# Patient Record
Sex: Male | Born: 1990 | Race: Black or African American | Hispanic: No | Smoking: Never smoker
Health system: Southern US, Community
[De-identification: ages and names within clinical notes are randomized; demographics above are authoritative.]

---
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
## 2011-05-21 ENCOUNTER — Other Ambulatory Visit: Payer: Self-pay

## 2011-05-21 ENCOUNTER — Encounter (HOSPITAL_COMMUNITY): Payer: Self-pay | Admitting: Emergency Medicine

## 2011-05-21 ENCOUNTER — Emergency Department (INDEPENDENT_AMBULATORY_CARE_PROVIDER_SITE_OTHER)
Admission: EM | Admit: 2011-05-21 | Discharge: 2011-05-21 | Disposition: A | Discharge status: Nursing Home (Includes ICF) | Payer: BC Managed Care – PPO | Source: Home / Self Care | Attending: Emergency Medicine | Admitting: Emergency Medicine

## 2011-05-21 ENCOUNTER — Emergency Department (HOSPITAL_COMMUNITY): Payer: BC Managed Care – PPO

## 2011-05-21 ENCOUNTER — Emergency Department (HOSPITAL_COMMUNITY)
Admission: EM | Admit: 2011-05-21 | Discharge: 2011-05-21 | Disposition: A | Discharge status: Home / Self Care | Payer: BC Managed Care – PPO | Attending: Emergency Medicine | Admitting: Emergency Medicine

## 2011-05-21 DIAGNOSIS — R55 Syncope and collapse: Secondary | ICD-10-CM

## 2011-05-21 DIAGNOSIS — R42 Dizziness and giddiness: Secondary | ICD-10-CM | POA: Insufficient documentation

## 2011-05-21 DIAGNOSIS — R001 Bradycardia, unspecified: Secondary | ICD-10-CM

## 2011-05-21 DIAGNOSIS — R5381 Other malaise: Secondary | ICD-10-CM | POA: Insufficient documentation

## 2011-05-21 DIAGNOSIS — I498 Other specified cardiac arrhythmias: Secondary | ICD-10-CM

## 2011-05-21 DIAGNOSIS — R079 Chest pain, unspecified: Secondary | ICD-10-CM | POA: Insufficient documentation

## 2011-05-21 LAB — DIFFERENTIAL
Eosinophils Absolute: 0.1 10*3/uL (ref 0.0–0.7)
Lymphs Abs: 2.6 10*3/uL (ref 0.7–4.0)
Neutro Abs: 2.8 10*3/uL (ref 1.7–7.7)
Neutrophils Relative %: 45 % (ref 43–77)

## 2011-05-21 LAB — RAPID URINE DRUG SCREEN, HOSP PERFORMED
Cocaine: NOT DETECTED
Opiates: NOT DETECTED
Tetrahydrocannabinol: POSITIVE — AB

## 2011-05-21 LAB — POCT I-STAT, CHEM 8
BUN: 11 mg/dL (ref 6–23)
Calcium, Ion: 1.23 mmol/L (ref 1.12–1.32)
Chloride: 106 mEq/L (ref 96–112)
Glucose, Bld: 91 mg/dL (ref 70–99)
HCT: 44 % (ref 39.0–52.0)

## 2011-05-21 LAB — CBC
MCH: 28.9 pg (ref 26.0–34.0)
Platelets: 174 10*3/uL (ref 150–400)
RBC: 4.92 MIL/uL (ref 4.22–5.81)
WBC: 6.2 10*3/uL (ref 4.0–10.5)

## 2011-05-21 MED ORDER — SODIUM CHLORIDE 0.9 % IV BOLUS (SEPSIS)
1000.0000 mL | Freq: Once | INTRAVENOUS | Status: AC
  Administered 2011-05-21: 1000 mL via INTRAVENOUS

## 2011-05-21 NOTE — ED Notes (Signed)
Onset 2 weeks ago of symptoms.  Reports headaches, dizziness, and reports an episode of syncope. Reports intermittent left chest pain, sharp pain.  Lying down seems to improve left chest pain.  Activity seems to make pain worse.  No nausea, no vomiting, "maybe" sob.

## 2011-05-21 NOTE — ED Provider Notes (Addendum)
History     CSN: [id number]  Arrival date & time 05/21/11  1105   First MD Initiated Contact with Patient 05/21/11 1208      Chief Complaint  Patient presents with  . Dizziness    (Consider location/radiation/quality/duration/timing/severity/associated sxs/prior treatment) HPI Comments: Patient presents urgent care complaining of multiple symptoms which include headaches dizziness and also experienced a syncopal episode yesterday while he was at the kitchen after having woken up. He also describes that during the last 2 weeks he's been feeling dizzy and fatigued and having headaches on and off and vocational left-sided chest pains that seem to be coming on and off for the course of 2 weeks last episode occurred yesterday as he was just sitting while he was not performing any physical activities. Locations have been felt a bit short of breath as well.    Patient is a 21 y.o. male presenting with syncope. The history is provided by the patient.  Loss of Consciousness This is a recurrent problem. The current episode started more than 1 week ago. Associated symptoms include chest pain, headaches and shortness of breath. The symptoms are aggravated by walking. The symptoms are relieved by nothing. He has tried nothing for the symptoms.    History reviewed. No pertinent past medical history.  History reviewed. No pertinent past surgical history.  Family History  Problem Relation Age of Onset  . Hypertension Mother   . Hypertension Father     History  Substance Use Topics  . Smoking status: Never Smoker   . Smokeless tobacco: Not on file  . Alcohol Use: Yes     ocassionally      Review of Systems  Constitutional: Positive for fatigue. Negative for diaphoresis.  Respiratory: Positive for cough and shortness of breath.   Cardiovascular: Positive for chest pain and syncope. Negative for palpitations.  Neurological: Positive for dizziness, syncope, light-headedness and headaches.  Negative for weakness.    Allergies  Review of patient's allergies indicates no known allergies.  Home Medications  No current outpatient prescriptions on file.  BP 125/70  Pulse 50  Temp(Src) 98.5 F (36.9 C) (Oral)  Resp 18  SpO2 100%  Physical Exam  Nursing note and vitals reviewed. Constitutional: He appears well-developed. No distress.  HENT:  Head: Normocephalic.  Eyes: Pupils are equal, round, and reactive to light.  Neck: Normal range of motion. Neck supple.  Cardiovascular: Normal heart sounds and normal pulses.   No extrasystoles are present. Bradycardia present.  PMI is not displaced.  Exam reveals no gallop, no friction rub and no decreased pulses.   No murmur heard. Pulmonary/Chest: Effort normal and breath sounds normal. He has no decreased breath sounds.  Neurological: He is alert. He has normal strength. He displays no atrophy. No cranial nerve deficit or sensory deficit. He exhibits normal muscle tone. He displays a negative Romberg sign. He displays no seizure activity.  Skin: He is not diaphoretic.    ED Course  Procedures (including critical care time)  Labs Reviewed - No data to display No results found.   1. Syncope   2. Bradycardia   3. Dizziness       MDM  Patient been symptomatic for about 2 weeks including dizziness and a episode of syncope and intermittent left-sided chest pains at urgent care seems normotensive with consistent bradycardia. Patient exhibits a first degree AV block pulse ranges from 48-52 per minute and patient is symptomatic. We will transfer to the emergency department for further evaluation.  Jimmie Molly, MD 05/21/11 1244  Jimmie Molly, MD 05/21/11 1257

## 2011-05-21 NOTE — Discharge Instructions (Signed)
Followup with [facility]., Call Monday  and make an appointment. If she starts feeling dizzy sit down immediately so you do not fall. Drink plenty of  fluids and stay hydrated. Return for severe pain shortness of breath or any other concerns. EKG today does not show any heart problems in her labs are normal. There is a copy in your discharge.

## 2011-05-21 NOTE — ED Notes (Signed)
Pt c/o intermittent chest pain x3wks. Pt states pain does not radiate and feels like a discomfort. Pt states he passed out 3 days ago and hit head on a door and on the ground. Pt states currently he feels weak, denies dizziness, n/v.

## 2011-05-21 NOTE — ED Notes (Signed)
Patient transported to X-ray 

## 2011-05-21 NOTE — ED Notes (Signed)
Pt sent from urgent care for further eval of dizziness and left sided chest pain x 2-3 weeks. Pt reports had syncopal episode yesterday.

## 2011-05-21 NOTE — ED Provider Notes (Signed)
Medical screening examination/treatment/procedure(s) were conducted as a shared visit with non-physician practitioner(s) and myself.  I personally evaluated the patient during the encounter  Timothy Horn, MD 05/21/11 2125

## 2011-05-21 NOTE — ED Provider Notes (Signed)
History     CSN: [id number]  Arrival date & time 05/21/11  1319   None     Chief Complaint  Patient presents with  . Dizziness  . Chest Pain    (Consider location/radiation/quality/duration/timing/severity/associated sxs/prior treatment) Patient is a 21 y.o. male presenting with chest pain. The history is provided by the patient. No language interpreter was used.  Chest Pain The chest pain began 1 - 2 weeks ago. Chest pain occurs intermittently. At its most intense, the pain is at 8/10. The pain is currently at 0/10. The severity of the pain is mild. The quality of the pain is described as aching. The pain does not radiate. Chest pain is worsened by exertion. Primary symptoms include fatigue, syncope and dizziness. Pertinent negatives for primary symptoms include no fever, no shortness of breath, no cough, no palpitations, no abdominal pain and no vomiting.  Before the onset of the syncopal episode there was dizziness. There was no weakness. The syncopal episode did not occur with palpitations or shortness of breath. There is no history of congestive heart failure.   Dizziness does not occur with vomiting, weakness or diaphoresis.  Pertinent negatives for associated symptoms include no claudication, no diaphoresis, no lower extremity edema, no numbness, no orthopnea, no paroxysmal nocturnal dyspnea and no weakness. He tried nothing for the symptoms. Risk factors include smoking/tobacco exposure, male gender and obesity.  Pertinent negatives for past medical history include no aneurysm, no anxiety/panic attacks, no arrhythmia, no CAD, no cancer, no COPD, no CHF, no diabetes and no DVT.  His family medical history is significant for hyperlipidemia in family and hypertension in family.  Procedure history is negative for cardiac catheterization, echocardiogram, persantine thallium, stress echo, stress thallium and exercise treadmill test.     History reviewed. No pertinent past medical  history.  History reviewed. No pertinent past surgical history.  Family History  Problem Relation Age of Onset  . Hypertension Mother   . Hypertension Father     History  Substance Use Topics  . Smoking status: Never Smoker   . Smokeless tobacco: Not on file  . Alcohol Use: Yes     ocassionally      Review of Systems  Constitutional: Positive for fatigue. Negative for fever and diaphoresis.  Respiratory: Negative for cough and shortness of breath.   Cardiovascular: Positive for chest pain and syncope. Negative for palpitations, orthopnea and claudication.  Gastrointestinal: Negative for vomiting and abdominal pain.  Neurological: Positive for dizziness. Negative for weakness and numbness.  All other systems reviewed and are negative.    Allergies  Review of patient's allergies indicates no known allergies.  Home Medications  No current outpatient prescriptions on file.  BP 126/66  Pulse 48  Temp(Src) 98.3 F (36.8 C) (Oral)  Resp 12  SpO2 100%  Physical Exam  Nursing note and vitals reviewed. Constitutional: He is oriented to person, place, and time. He appears well-developed and well-nourished.  HENT:  Head: Normocephalic and atraumatic.  Eyes: Pupils are equal, round, and reactive to light.  Neck: Neck supple.  Cardiovascular: Normal rate and regular rhythm.  Exam reveals no gallop and no friction rub.   No murmur heard. Pulmonary/Chest: Effort normal and breath sounds normal. No respiratory distress.  Abdominal: Soft. He exhibits no distension.  Musculoskeletal: Normal range of motion.  Neurological: He is alert and oriented to person, place, and time. No cranial nerve deficit.  Skin: Skin is warm and dry.  Psychiatric: He has a normal mood and  affect.    ED Course  Procedures (including critical care time)   Labs Reviewed  CBC  DIFFERENTIAL  URINALYSIS, ROUTINE W REFLEX MICROSCOPIC   No results found.   No diagnosis found.    MDM    Labs Reviewed  URINE RAPID DRUG SCREEN (HOSP PERFORMED) - Abnormal; Notable for the following:    Tetrahydrocannabinol POSITIVE (*)    All other components within normal limits  CBC  DIFFERENTIAL  POCT I-STAT, CHEM 8  POCT I-STAT TROPONIN I       Date: 05/21/2011  Rate: 45  Rhythm: normal sinus rhythm  QRS Axis: normal  Intervals: normal  ST/T Wave abnormalities: normal  Conduction Disutrbances:none  Narrative Interpretation: ST elev probable normal early repol pattern  Old EKG Reviewed: none available  From urgent care with c/o syncopal episode yesterday and intermittant chest pain, dizziness and fatigue x 2 weeks.  EKG sb x 2.  No further chest pain in ER, perc negative.  No long trips or calf pain or sudden death family hx.  No acute distress noted. No orthostatic.  Will follow up with [facility] this week.  Return if worse.    Jethro Bastos, NP 05/21/11 2011

## 2011-05-21 NOTE — ED Notes (Signed)
Pt returned from xray

## 2011-05-21 NOTE — ED Provider Notes (Signed)
This 21 year old male has felt generally weak and lightheaded for the last couple of weeks and yesterday felt lightheaded got gradually worse the leg is pass out couldn't make it back to his bed time as he is feeling lightheaded and generally weak and then had a brief witnessed syncopal spell and hit his head but woke up without severe headache or any focal neurologic symptoms, he does not have any history of any exertional chest discomfort or syncope.  Hurman Horn, MD 05/21/11 2141

## 2013-03-31 ENCOUNTER — Encounter (HOSPITAL_COMMUNITY): Payer: Self-pay | Admitting: Emergency Medicine

## 2013-03-31 ENCOUNTER — Emergency Department (INDEPENDENT_AMBULATORY_CARE_PROVIDER_SITE_OTHER)
Admission: EM | Admit: 2013-03-31 | Discharge: 2013-03-31 | Disposition: A | Discharge status: Home / Self Care | Payer: BC Managed Care – PPO | Source: Home / Self Care | Attending: Family Medicine | Admitting: Family Medicine

## 2013-03-31 DIAGNOSIS — J069 Acute upper respiratory infection, unspecified: Secondary | ICD-10-CM

## 2013-03-31 LAB — POCT RAPID STREP A: Streptococcus, Group A Screen (Direct): NEGATIVE

## 2013-03-31 MED ORDER — IPRATROPIUM BROMIDE 0.06 % NA SOLN: 2.0000 | Freq: Four times a day (QID) | NASAL | Status: DC

## 2013-03-31 MED ORDER — GUAIFENESIN-CODEINE 100-10 MG/5ML PO SOLN: 5.0000 mL | Freq: Every evening | ORAL | Status: DC | PRN

## 2013-03-31 MED ORDER — PREDNISONE 10 MG PO TABS: 30.0000 mg | ORAL_TABLET | Freq: Every day | ORAL | Status: DC

## 2013-03-31 NOTE — ED Notes (Signed)
C/o cold sx States head pressure, body ache, cough, sneezing, and runny nose.  otc medication taking but no relief.

## 2013-03-31 NOTE — ED Provider Notes (Signed)
Timothy Wagner is a 22 y.o. male who presents to Urgent Care today for 3 days of congestion runny nose headache and body aches sore throat and cough. Patient has tried TheraFlu NyQuil and Tylenol. This helps a bit. No cervical bleeding no nausea vomiting or diarrhea. Patient does note decreased hearing in his left ear. He feels well otherwise.   History reviewed. No pertinent past medical history. History  Substance Use Topics  . Smoking status: Never Smoker   . Smokeless tobacco: Not on file  . Alcohol Use: Yes     Comment: ocassionally   ROS as above Medications reviewed. No current facility-administered medications for this encounter.   Current Outpatient Prescriptions  Medication Sig Dispense Refill  . guaiFENesin-codeine 100-10 MG/5ML syrup Take 5 mLs by mouth at bedtime as needed for cough.  120 mL  0  . ipratropium (ATROVENT) 0.06 % nasal spray Place 2 sprays into both nostrils 4 (four) times daily.  15 mL  1  . predniSONE (DELTASONE) 10 MG tablet Take 3 tablets (30 mg total) by mouth daily.  15 tablet  0    Exam:  BP 126/80  Pulse 65  Temp(Src) 99.5 F (37.5 C) (Oral)  SpO2 96% Gen: Well NAD HEENT: EOMI,  MMM right tympanic membrane is normal. Left is occluded by a wad of cotton. Once the cotton was removed the patient had normal appearing tympanic membrane. His posterior pharynx showed cobblestoning Lungs: Normal work of breathing. CTABL Heart: RRR no MRG Abd: NABS, Soft. NT, ND Exts: Non edematous BL  LE, warm and well perfused.   Results for orders placed during the hospital encounter of 03/31/13 (from the past 24 hour(s))  POCT RAPID STREP A ([facility]ARE ONLY)     Status: None   Collection Time    03/31/13  1:26 PM      Result Value Range   Streptococcus, Group A Screen (Direct) NEGATIVE  NEGATIVE   No results found.  Assessment and Plan: 22 y.o. male with viral URI. Plan for symptomatic management with low dose and short duration prednisone, Atrovent  nasal spray, and codeine containing cough medication. Work note provided. Discussed warning signs or symptoms. Please see discharge instructions. Patient expresses understanding.      Rodolph Bong, MD 03/31/13 1341

## 2013-04-02 LAB — CULTURE, GROUP A STREP

## 2014-04-25 ENCOUNTER — Emergency Department (HOSPITAL_COMMUNITY)
Admission: EM | Admit: 2014-04-25 | Discharge: 2014-04-25 | Disposition: A | Discharge status: Home / Self Care | Payer: BLUE CROSS/BLUE SHIELD | Attending: Emergency Medicine | Admitting: Emergency Medicine

## 2014-04-25 ENCOUNTER — Encounter (HOSPITAL_COMMUNITY): Payer: Self-pay | Admitting: Emergency Medicine

## 2014-04-25 DIAGNOSIS — N4889 Other specified disorders of penis: Secondary | ICD-10-CM | POA: Diagnosis present

## 2014-04-25 DIAGNOSIS — Z79899 Other long term (current) drug therapy: Secondary | ICD-10-CM | POA: Insufficient documentation

## 2014-04-25 DIAGNOSIS — Z7952 Long term (current) use of systemic steroids: Secondary | ICD-10-CM | POA: Diagnosis not present

## 2014-04-25 MED ORDER — AZITHROMYCIN 250 MG PO TABS
1000.0000 mg | ORAL_TABLET | Freq: Once | ORAL | Status: AC
  Administered 2014-04-25: 1000 mg via ORAL
  Filled 2014-04-25: qty 4

## 2014-04-25 MED ORDER — CEFTRIAXONE SODIUM 250 MG IJ SOLR
250.0000 mg | Freq: Once | INTRAMUSCULAR | Status: AC
  Administered 2014-04-25: 250 mg via INTRAMUSCULAR
  Filled 2014-04-25: qty 250

## 2014-04-25 NOTE — ED Provider Notes (Signed)
CSN: [id number]     Arrival date & time 04/25/14  1042 History   First MD Initiated Contact with Patient 04/25/14 1050     No chief complaint on file.    (Consider location/radiation/quality/duration/timing/severity/associated sxs/prior Treatment) HPI   Pt presents with tingling in the tip of his penis x several weeks.  He has two male sexual partners and has unprotected sex with both - he does not know of any problems they may or may not be having.  Denies fevers, chills, myalgias, abdominal pain, N/V, penile discharge, genital lesions, dysuria, urinary frequency or urgency, testicular pain or swelling.     History reviewed. No pertinent past medical history. History reviewed. No pertinent past surgical history. Family History  Problem Relation Age of Onset  . Hypertension Mother   . Hypertension Father    History  Substance Use Topics  . Smoking status: Never Smoker   . Smokeless tobacco: Not on file  . Alcohol Use: Yes     Comment: ocassionally    Review of Systems  Constitutional: Negative for fever and chills.  Gastrointestinal: Negative for nausea, vomiting and abdominal pain.  Genitourinary: Negative for dysuria, frequency, discharge, penile swelling, scrotal swelling and testicular pain.  Musculoskeletal: Negative for myalgias.  Skin: Negative for color change and wound.  Allergic/Immunologic: Negative for immunocompromised state.      Allergies  Review of patient's allergies indicates no known allergies.  Home Medications   Prior to Admission medications   Medication Sig Start Date End Date Taking? Authorizing Provider  guaiFENesin-codeine 100-10 MG/5ML syrup Take 5 mLs by mouth at bedtime as needed for cough. 03/31/13   Rodolph BongEvan S Corey, MD  ipratropium (ATROVENT) 0.06 % nasal spray Place 2 sprays into both nostrils 4 (four) times daily. 03/31/13   Rodolph BongEvan S Corey, MD  predniSONE (DELTASONE) 10 MG tablet Take 3 tablets (30 mg total) by mouth daily. 03/31/13   Rodolph BongEvan  S Corey, MD   BP 170/78 mmHg  Pulse 74  Temp(Src) 98 F (36.7 C) (Oral)  Resp 19  SpO2 100% Physical Exam  Constitutional: He appears well-developed and well-nourished. No distress.  HENT:  Head: Normocephalic and atraumatic.  Neck: Neck supple.  Pulmonary/Chest: Effort normal.  Abdominal: Soft. There is no tenderness. There is no rigidity, no rebound and no guarding.  Genitourinary: Testes normal and penis normal. Right testis shows no mass, no swelling and no tenderness. Right testis is descended. Left testis shows no mass, no swelling and no tenderness. Left testis is descended. Circumcised. No penile erythema or penile tenderness. No discharge found.  Lymphadenopathy:       Right: No inguinal adenopathy present.       Left: No inguinal adenopathy present.  Neurological: He is alert.  Skin: He is not diaphoretic.  Nursing note and vitals reviewed.   ED Course  Procedures (including critical care time) Labs Review Labs Reviewed  HIV ANTIBODY (ROUTINE TESTING)  RPR  GC/CHLAMYDIA PROBE AMP ([place])    Imaging Review No results found.   EKG Interpretation None      MDM   Final diagnoses:  Pain in penis    Afebrile, nontoxic patient with tingling at tip of penis x weeks.  Engages in unprotected sexual activity.  No other symptoms.  Full HIV/STD panel - pt aware all results are pending.  Exam unremarkable. He requests treatment for GC/Chlam in ED.   D/C home with referral to health department, information about safe sex and STDs.   Discussed result,  findings, treatment, and follow up  with patient.  Pt given return precautions.  Pt verbalizes understanding and agrees with plan.         Trixie Dredge, PA-C 04/25/14 1136  Tilden Fossa, MD 04/25/14 1137

## 2014-04-25 NOTE — Discharge Instructions (Signed)
Read the information below.  You may return to the Emergency Department at any time for worsening condition or any new symptoms that concern you.  If you develop high fevers, testicular or abdominal pain, uncontrolled vomiting, or are unable to tolerate fluids by mouth, return to the ER for a recheck.

## 2014-04-25 NOTE — ED Notes (Signed)
C/O "tingling" in urethra x 2 weeks. Denies discharge or burning with urination.

## 2014-04-26 LAB — HIV ANTIBODY (ROUTINE TESTING W REFLEX): HIV-1/HIV-2 Ab: NONREACTIVE

## 2014-04-26 LAB — RPR: RPR Ser Ql: NONREACTIVE

## 2014-04-28 LAB — GC/CHLAMYDIA PROBE AMP (~~LOC~~) NOT AT ARMC
Chlamydia: NEGATIVE
Neisseria Gonorrhea: NEGATIVE

## 2015-06-23 ENCOUNTER — Emergency Department (HOSPITAL_COMMUNITY)
Admission: EM | Admit: 2015-06-23 | Discharge: 2015-06-23 | Discharge status: Against Medical Advice | Payer: BLUE CROSS/BLUE SHIELD

## 2015-06-23 NOTE — ED Notes (Signed)
Called pt multiple times with no answer.  

## 2015-06-23 NOTE — ED Notes (Signed)
Attempted to call pt to triage x1.  

## 2015-06-24 ENCOUNTER — Emergency Department (HOSPITAL_COMMUNITY)
Admission: EM | Admit: 2015-06-24 | Discharge: 2015-06-24 | Disposition: A | Discharge status: Home / Self Care | Payer: BLUE CROSS/BLUE SHIELD | Attending: Emergency Medicine | Admitting: Emergency Medicine

## 2015-06-24 ENCOUNTER — Encounter (HOSPITAL_COMMUNITY): Payer: Self-pay | Admitting: Emergency Medicine

## 2015-06-24 DIAGNOSIS — Z202 Contact with and (suspected) exposure to infections with a predominantly sexual mode of transmission: Secondary | ICD-10-CM

## 2015-06-24 DIAGNOSIS — Z7952 Long term (current) use of systemic steroids: Secondary | ICD-10-CM | POA: Diagnosis not present

## 2015-06-24 LAB — URINALYSIS, ROUTINE W REFLEX MICROSCOPIC
BILIRUBIN URINE: NEGATIVE
Glucose, UA: NEGATIVE mg/dL
Hgb urine dipstick: NEGATIVE
Ketones, ur: NEGATIVE mg/dL
Leukocytes, UA: NEGATIVE
NITRITE: NEGATIVE
Protein, ur: NEGATIVE mg/dL
SPECIFIC GRAVITY, URINE: 1.031 — AB (ref 1.005–1.030)
pH: 6 (ref 5.0–8.0)

## 2015-06-24 MED ORDER — STERILE WATER FOR INJECTION IJ SOLN
INTRAMUSCULAR | Status: AC
  Filled 2015-06-24: qty 10

## 2015-06-24 MED ORDER — AZITHROMYCIN 250 MG PO TABS
1000.0000 mg | ORAL_TABLET | Freq: Once | ORAL | Status: AC
  Administered 2015-06-24: 1000 mg via ORAL
  Filled 2015-06-24: qty 4

## 2015-06-24 MED ORDER — CEFTRIAXONE SODIUM 250 MG IJ SOLR
250.0000 mg | INTRAMUSCULAR | Status: DC
  Administered 2015-06-24: 250 mg via INTRAMUSCULAR
  Filled 2015-06-24: qty 250

## 2015-06-24 NOTE — ED Notes (Signed)
Patient states possibly exposed to chlamydia.   Patient denies any symptoms.

## 2015-06-24 NOTE — ED Provider Notes (Signed)
CSN: [id number]     Arrival date & time 06/24/15  1041 History   First MD Initiated Contact with Patient 06/24/15 1139     Chief Complaint  Patient presents with  . SEXUALLY TRANSMITTED DISEASE     (Consider location/radiation/quality/duration/timing/severity/associated sxs/prior Treatment) HPI Comments: Pt states that he was exposed to chylamydia. Denies any discharge or urinary symptoms. No abdominal pain or fever. Has history of std 6 years ago. Pt is not allergic to any medications.  The history is provided by the patient. No language interpreter was used.    History reviewed. No pertinent past medical history. History reviewed. No pertinent past surgical history. Family History  Problem Relation Age of Onset  . Hypertension Mother   . Hypertension Father    Social History  Substance Use Topics  . Smoking status: Never Smoker   . Smokeless tobacco: None  . Alcohol Use: Yes     Comment: ocassionally    Review of Systems  All other systems reviewed and are negative.     Allergies  Review of patient's allergies indicates no known allergies.  Home Medications   Prior to Admission medications   Medication Sig Start Date End Date Taking? Authorizing Provider  guaiFENesin-codeine 100-10 MG/5ML syrup Take 5 mLs by mouth at bedtime as needed for cough. 03/31/13   Rodolph BongEvan S Corey, MD  ipratropium (ATROVENT) 0.06 % nasal spray Place 2 sprays into both nostrils 4 (four) times daily. 03/31/13   Rodolph BongEvan S Corey, MD  predniSONE (DELTASONE) 10 MG tablet Take 3 tablets (30 mg total) by mouth daily. 03/31/13   Rodolph BongEvan S Corey, MD   BP 159/101 mmHg  Pulse 63  Temp(Src) 98.4 F (36.9 C) (Oral)  Resp 18  SpO2 97% Physical Exam  Constitutional: He is oriented to person, place, and time. He appears well-developed and well-nourished.  Cardiovascular: Normal rate and regular rhythm.   Pulmonary/Chest: Effort normal and breath sounds normal.  Abdominal: Soft. Bowel sounds are normal. There  is no tenderness.  Genitourinary: Penis normal. No penile tenderness.  Musculoskeletal: Normal range of motion.  Neurological: He is alert and oriented to person, place, and time.  Skin: Skin is warm and dry.  Nursing note and vitals reviewed.   ED Course  Procedures (including critical care time) Labs Review Labs Reviewed  URINALYSIS, ROUTINE W REFLEX MICROSCOPIC (NOT AT [facility]) - Abnormal; Notable for the following:    Specific Gravity, Urine 1.031 (*)    All other components within normal limits  GC/CHLAMYDIA PROBE AMP ([place]    Imaging Review No results found. I have personally reviewed and evaluated these images and lab results as part of my medical decision-making.   EKG Interpretation None      MDM   Final diagnoses:  STD exposure    Pt treated no sign of infection in the urine    Teressa LowerVrinda Ferdinand Revoir, NP 06/24/15 1247  Blane OharaJoshua Zavitz, MD 06/24/15 1714

## 2015-06-24 NOTE — Discharge Instructions (Signed)
Sexually Transmitted Disease °A sexually transmitted disease (STD) is a disease or infection that may be passed (transmitted) from person to person, usually during sexual activity. This may happen by way of saliva, semen, blood, vaginal mucus, or urine. Common STDs include: °· Gonorrhea. °· Chlamydia. °· Syphilis. °· HIV and AIDS. °· Genital herpes. °· Hepatitis B and C. °· Trichomonas. °· Human papillomavirus (HPV). °· Pubic lice. °· Scabies. °· Mites. °· Bacterial vaginosis. °WHAT ARE CAUSES OF STDs? °An STD may be caused by bacteria, a virus, or parasites. STDs are often transmitted during sexual activity if one person is infected. However, they may also be transmitted through nonsexual means. STDs may be transmitted after:  °· Sexual intercourse with an infected person. °· Sharing sex toys with an infected person. °· Sharing needles with an infected person or using unclean piercing or tattoo needles. °· Having intimate contact with the genitals, mouth, or rectal areas of an infected person. °· Exposure to infected fluids during birth. °WHAT ARE THE SIGNS AND SYMPTOMS OF STDs? °Different STDs have different symptoms. Some people may not have any symptoms. If symptoms are present, they may include: °· Painful or bloody urination. °· Pain in the pelvis, abdomen, vagina, anus, throat, or eyes. °· A skin rash, itching, or irritation. °· Growths, ulcerations, blisters, or sores in the genital and anal areas. °· Abnormal vaginal discharge with or without bad odor. °· Penile discharge in men. °· Fever. °· Pain or bleeding during sexual intercourse. °· Swollen glands in the groin area. °· Yellow skin and eyes (jaundice). This is seen with hepatitis. °· Swollen testicles. °· Infertility. °· Sores and blisters in the mouth. °HOW ARE STDs DIAGNOSED? °To make a diagnosis, your health care provider may: °· Take a medical history. °· Perform a physical exam. °· Take a sample of any discharge to examine. °· Swab the throat,  cervix, opening to the penis, rectum, or vagina for testing. °· Test a sample of your first morning urine. °· Perform blood tests. °· Perform a Pap test, if this applies. °· Perform a colposcopy. °· Perform a laparoscopy. °HOW ARE STDs TREATED? °Treatment depends on the STD. Some STDs may be treated but not cured. °· Chlamydia, gonorrhea, trichomonas, and syphilis can be cured with antibiotic medicine. °· Genital herpes, hepatitis, and HIV can be treated, but not cured, with prescribed medicines. The medicines lessen symptoms. °· Genital warts from HPV can be treated with medicine or by freezing, burning (electrocautery), or surgery. Warts may come back. °· HPV cannot be cured with medicine or surgery. However, abnormal areas may be removed from the cervix, vagina, or vulva. °· If your diagnosis is confirmed, your recent sexual partners need treatment. This is true even if they are symptom-free or have a negative culture or evaluation. They should not have sex until their health care providers say it is okay. °· Your health care provider may test you for infection again 3 months after treatment. °HOW CAN I REDUCE MY RISK OF GETTING AN STD? °Take these steps to reduce your risk of getting an STD: °· Use latex condoms, dental dams, and water-soluble lubricants during sexual activity. Do not use petroleum jelly or oils. °· Avoid having multiple sex partners. °· Do not have sex with someone who has other sex partners °· Do not have sex with anyone you do not know or who is at high risk for an STD. °· Avoid risky sex practices that can break your skin. °· Do not have sex   if you have open sores on your mouth or skin. °· Avoid drinking too much alcohol or taking illegal drugs. Alcohol and drugs can affect your judgment and put you in a vulnerable position. °· Avoid engaging in oral and anal sex acts. °· Get vaccinated for HPV and hepatitis. If you have not received these vaccines in the past, talk to your health care  provider about whether one or both might be right for you. °· If you are at risk of being infected with HIV, it is recommended that you take a prescription medicine daily to prevent HIV infection. This is called pre-exposure prophylaxis (PrEP). You are considered at risk if: °¨ You are a man who has sex with other men (MSM). °¨ You are a heterosexual man or woman and are sexually active with more than one partner. °¨ You take drugs by injection. °¨ You are sexually active with a partner who has HIV. °· Talk with your health care provider about whether you are at high risk of being infected with HIV. If you choose to begin PrEP, you should first be tested for HIV. You should then be tested every 3 months for as long as you are taking PrEP. °WHAT SHOULD I DO IF I THINK I HAVE AN STD? °· See your health care provider. °· Tell your sexual partner(s). They should be tested and treated for any STDs. °· Do not have sex until your health care provider says it is okay. °WHEN SHOULD I GET IMMEDIATE MEDICAL CARE? °Contact your health care provider right away if:  °· You have severe abdominal pain. °· You are a man and notice swelling or pain in your testicles. °· You are a woman and notice swelling or pain in your vagina. °  °This information is not intended to replace advice given to you by your health care provider. Make sure you discuss any questions you have with your health care provider. °  °Document Released: 06/11/2002 Document Revised: 04/11/2014 Document Reviewed: 10/09/2012 °Elsevier Interactive Patient Education ©2016 [employer] ° °

## 2015-06-25 LAB — GC/CHLAMYDIA PROBE AMP (~~LOC~~) NOT AT ARMC
Chlamydia: NEGATIVE
NEISSERIA GONORRHEA: NEGATIVE

## 2017-02-27 ENCOUNTER — Ambulatory Visit (INDEPENDENT_AMBULATORY_CARE_PROVIDER_SITE_OTHER): Payer: BLUE CROSS/BLUE SHIELD

## 2017-02-27 ENCOUNTER — Ambulatory Visit (HOSPITAL_COMMUNITY)
Admission: EM | Admit: 2017-02-27 | Discharge: 2017-02-27 | Disposition: A | Discharge status: Home / Self Care | Payer: BLUE CROSS/BLUE SHIELD | Attending: Physician Assistant | Admitting: Physician Assistant

## 2017-02-27 ENCOUNTER — Encounter (HOSPITAL_COMMUNITY): Payer: Self-pay | Admitting: Emergency Medicine

## 2017-02-27 DIAGNOSIS — S63501A Unspecified sprain of right wrist, initial encounter: Secondary | ICD-10-CM | POA: Diagnosis not present

## 2017-02-27 MED ORDER — NAPROXEN 500 MG PO TABS: 500.0000 mg | ORAL_TABLET | Freq: Two times a day (BID) | ORAL | 0 refills | Status: DC

## 2017-02-27 NOTE — ED Triage Notes (Signed)
Pt sts right wrist pain after horse play with friend 3 days ago

## 2017-02-27 NOTE — ED Provider Notes (Signed)
[facility]    CSN: [id number] Arrival date & time: 02/27/17  1257     History   Chief Complaint Chief Complaint  Patient presents with  . Wrist Pain    HPI Timothy Wagner is a 26 y.o. male.   26 year-old male, presenting today complaining of right wrist pain. States that he was "playing around" 3 days ago and believes he twisted his wrist. Pain and swelling since that time. He is right handed. Has not tried anything at home for his symptoms.    The history is provided by the patient.  Wrist Pain  This is a new problem. The current episode started more than 2 days ago. The problem occurs constantly. The problem has not changed since onset.Pertinent negatives include no chest pain, no abdominal pain, no headaches and no shortness of breath. The symptoms are aggravated by twisting and bending. Nothing relieves the symptoms. He has tried nothing for the symptoms. The treatment provided no relief.    History reviewed. No pertinent past medical history.  There are no active problems to display for this patient.   History reviewed. No pertinent surgical history.     Home Medications    Prior to Admission medications   Medication Sig Start Date End Date Taking? Authorizing Provider  guaiFENesin-codeine 100-10 MG/5ML syrup Take 5 mLs by mouth at bedtime as needed for cough. 03/31/13   Rodolph Bongorey, Evan S, MD  ipratropium (ATROVENT) 0.06 % nasal spray Place 2 sprays into both nostrils 4 (four) times daily. 03/31/13   Rodolph Bongorey, Evan S, MD  naproxen (NAPROSYN) 500 MG tablet Take 1 tablet (500 mg total) by mouth 2 (two) times daily. 02/27/17   Blue, Olivia C, PA-C  predniSONE (DELTASONE) 10 MG tablet Take 3 tablets (30 mg total) by mouth daily. 03/31/13   Rodolph Bongorey, Evan S, MD    Family History Family History  Problem Relation Age of Onset  . Hypertension Mother   . Hypertension Father     Social History Social History   Tobacco Use  . Smoking status: Never Smoker    Substance Use Topics  . Alcohol use: Yes    Comment: ocassionally  . Drug use: Yes    Types: Marijuana    Comment: daily     Allergies   Patient has no known allergies.   Review of Systems Review of Systems  Constitutional: Negative for chills and fever.  HENT: Negative for ear pain and sore throat.   Eyes: Negative for pain and visual disturbance.  Respiratory: Negative for cough and shortness of breath.   Cardiovascular: Negative for chest pain and palpitations.  Gastrointestinal: Negative for abdominal pain and vomiting.  Genitourinary: Negative for dysuria and hematuria.  Musculoskeletal: Positive for joint swelling (right wrist ). Negative for arthralgias and back pain.  Skin: Negative for color change and rash.  Neurological: Negative for seizures, syncope and headaches.  All other systems reviewed and are negative.    Physical Exam Triage Vital Signs ED Triage Vitals [02/27/17 1320]  Enc Vitals Group     BP 132/83     Pulse Rate (!) 56     Resp 18     Temp 98.6 F (37 C)     Temp Source Oral     SpO2 95 %     Weight      Height      Head Circumference      Peak Flow      Pain Score 9  Pain Loc      Pain Edu?      Excl. in GC?    No data found.  Updated Vital Signs BP 132/83 (BP Location: Right Arm)   Pulse (!) 56   Temp 98.6 F (37 C) (Oral)   Resp 18   SpO2 95%   Visual Acuity Right Eye Distance:   Left Eye Distance:   Bilateral Distance:    Right Eye Near:   Left Eye Near:    Bilateral Near:     Physical Exam  Constitutional: He appears well-developed and well-nourished.  HENT:  Head: Normocephalic and atraumatic.  Eyes: Conjunctivae are normal.  Neck: Neck supple.  Cardiovascular: Normal rate and regular rhythm.  No murmur heard. Pulmonary/Chest: Effort normal and breath sounds normal. No respiratory distress.  Abdominal: Soft. There is no tenderness.  Musculoskeletal: He exhibits no edema.       Right wrist: He exhibits  tenderness and swelling.  Tenderness to palpation of the dorsal aspect of the right wrist. Associated swelling. Decreased ROM due to pain and swelling. Good radial pulse and cap refill. NV intact  Neurological: He is alert.  Skin: Skin is warm and dry.  Psychiatric: He has a normal mood and affect.  Nursing note and vitals reviewed.    UC Treatments / Results  Labs (all labs ordered are listed, but only abnormal results are displayed) Labs Reviewed - No data to display  EKG  EKG Interpretation None       Radiology Dg Wrist Complete Right  Result Date: 02/27/2017 CLINICAL DATA:  Injury 3 days ago EXAM: RIGHT WRIST - COMPLETE 3+ VIEW COMPARISON:  None. FINDINGS: There is a tiny bony density dorsal to the carpal row on the lateral view only which has a chronic appearance. No acute fracture. No dislocation. Unremarkable soft tissues. IMPRESSION: No acute bony pathology. Electronically Signed   By: Jolaine ClickArthur  Hoss M.D.   On: 02/27/2017 13:50    Procedures Procedures (including critical care time)  Medications Ordered in UC Medications - No data to display   Initial Impression / Assessment and Plan / UC Course  I have reviewed the triage vital signs and the nursing notes.  Pertinent labs & imaging results that were available during my care of the patient were reviewed by me and considered in my medical decision making (see chart for details).     Normal xr without evidence of fracture RICE, wrist splint for sprain NSAIDs  Final Clinical Impressions(s) / UC Diagnoses   Final diagnoses:  Sprain of right wrist, initial encounter    ED Discharge Orders        Ordered    naproxen (NAPROSYN) 500 MG tablet  2 times daily     02/27/17 1406       Controlled Substance Prescriptions [place] Controlled Substance Registry consulted? Not Applicable   Alecia LemmingBlue, Olivia C, [place]PA-C 02/27/17 1426

## 2017-09-27 ENCOUNTER — Other Ambulatory Visit: Payer: Self-pay

## 2017-09-27 ENCOUNTER — Ambulatory Visit (HOSPITAL_COMMUNITY)
Admission: EM | Admit: 2017-09-27 | Discharge: 2017-09-27 | Disposition: A | Discharge status: Home / Self Care | Payer: BLUE CROSS/BLUE SHIELD | Attending: Family Medicine | Admitting: Family Medicine

## 2017-09-27 ENCOUNTER — Encounter (HOSPITAL_COMMUNITY): Payer: Self-pay

## 2017-09-27 ENCOUNTER — Ambulatory Visit (INDEPENDENT_AMBULATORY_CARE_PROVIDER_SITE_OTHER): Payer: BLUE CROSS/BLUE SHIELD

## 2017-09-27 DIAGNOSIS — R0781 Pleurodynia: Secondary | ICD-10-CM

## 2017-09-27 DIAGNOSIS — R079 Chest pain, unspecified: Secondary | ICD-10-CM | POA: Diagnosis not present

## 2017-09-27 MED ORDER — DICLOFENAC SODIUM 75 MG PO TBEC: 75.0000 mg | DELAYED_RELEASE_TABLET | Freq: Two times a day (BID) | ORAL | 0 refills | Status: DC

## 2017-09-27 NOTE — ED Provider Notes (Signed)
[facility]   [id number] 09/27/17 Arrival Time: 1415  ASSESSMENT & PLAN:  1. Chest pain, unspecified type    ECG with sinus bradycardia. No acute changes.  Meds ordered this encounter  Medications  . diclofenac (VOLTAREN) 75 MG EC tablet    Sig: Take 1 tablet (75 mg total) by mouth 2 (two) times daily.    Dispense:  14 tablet    Refill:  0   Patient history and exam consistent with non-cardiac cause of chest pain. Conservative measures indicated. Prescription NSAIDs per medication orders. Worsening signs and symptoms discussed and patient verbalized understanding.  Chest pain precautions given. Reviewed expectations re: course of current medical issues. Questions answered. Outlined signs and symptoms indicating need for more acute intervention. Patient verbalized understanding. After Visit Summary given.   SUBJECTIVE:  Timothy Wagner is a 27 y.o. male who presents with complaint of:  Chest Discomfort: Onset gradual, 2 days ago, with unchanged course since that time. Describes discomfort as fairly persistent and costochondral and dull in nature. Discomfort does not radiate. Mostly L-sided. No injury/trauma reported. Rates discomfort as a 4/10 in intensity. Associated symptoms: none reported; no SOB/n/v/diaphoresis. Aggravating factors: certain movements. Alleviating factors: none in particular.  Cardiac risk factors: sedentary lifestyle; does smoke marijuana daily Previous cardiac testing: none.  OTC treatment: Single 800mg  dose of ibuprofen; 'not much help'.  ROS: As per HPI.   OBJECTIVE:  Vitals:   09/27/17 1517  BP: 126/76  Pulse: 69  Resp: 20  Temp: 98.3 F (36.8 C)  TempSrc: Temporal  SpO2: 99%    General appearance: alert; no distress Eyes: PERRLA; EOMI; conjunctiva normal HENT: normocephalic; atraumatic Neck: supple Lungs: clear to auscultation bilaterally Heart: regular rate and rhythm Chest Wall: mild tenderness when  palpating anteriorly Abdomen: soft, non-tender; no guarding or rebound tenderness Extremities: no edema; symmetrical with no gross deformities Skin: warm and dry Psychological: alert and cooperative; normal mood and affect  ECG: Orders placed or performed during the hospital encounter of 09/27/17  . ED EKG  . ED EKG   Imaging: Dg Chest 2 View  Result Date: 09/27/2017 CLINICAL DATA:  Pt states anterior left rib area is hurting and radiates to left posterior ribs x2 days. No known heart or lung conditions. Smoker 1 cigarette a day. EXAM: CHEST - 2 VIEW COMPARISON:  05/21/2011 FINDINGS: The heart size and mediastinal contours are within normal limits. Both lungs are clear. The visualized skeletal structures are unremarkable. IMPRESSION: No active cardiopulmonary disease. Electronically Signed   By: Norva Pavlov M.D.   On: 09/27/2017 15:53    No Known Allergies  PMH: no h/o chest pain  Social History   Socioeconomic History  . Marital status: Single    Spouse name: Not on file  . Number of children: Not on file  . Years of education: Not on file  . Highest education level: Not on file  Occupational History  . Not on file  Social Needs  . Financial resource strain: Not on file  . Food insecurity:    Worry: Not on file    Inability: Not on file  . Transportation needs:    Medical: Not on file    Non-medical: Not on file  Tobacco Use  . Smoking status: Never Smoker  Substance and Sexual Activity  . Alcohol use: Yes    Comment: ocassionally  . Drug use: Yes    Types: Marijuana    Comment: daily  . Sexual activity: Not on file  Lifestyle  . Physical activity:    Days per week: Not on file    Minutes per session: Not on file  . Stress: Not on file  Relationships  . Social connections:    Talks on phone: Not on file    Gets together: Not on file    Attends religious service: Not on file    Active member of club or organization: Not on file    Attends meetings of  clubs or organizations: Not on file    Relationship status: Not on file  . Intimate partner violence:    Fear of current or ex partner: Not on file    Emotionally abused: Not on file    Physically abused: Not on file    Forced sexual activity: Not on file  Other Topics Concern  . Not on file  Social History Narrative  . Not on file   Family History  Problem Relation Age of Onset  . Hypertension Mother   . Hypertension Father    History reviewed. No pertinent surgical history.   Mardella LaymanHagler, Zuriyah Shatz, MD 09/27/17 1728

## 2017-09-27 NOTE — Discharge Instructions (Addendum)
You have been seen at the [facility] today for chest pain. Your evaluation today was not suggestive of any emergent condition requiring medical intervention at this time. Your EKG did not show any worrisome changes. However, some medical problems make take more time to appear. Therefore, it is important for you to watch for any new symptoms or worsening of your current condition. ° °Please return the Urgent Care or to the Emergency Department immediately should you feel worse in any way or have any of the following symptoms: increasing or different chest pain, pain that spreads to your arm, neck, jaw, back or abdomen, shortness of breath, or nausea and vomiting. ° °

## 2017-09-27 NOTE — ED Triage Notes (Addendum)
Pt states left rib area is hurting and kind of radiates to left back area. Worse with movement

## 2018-11-14 IMAGING — DX DG CHEST 2V
2 series · 2 of 2 positions shown · non-contrast
Comparison: 05/21/2011

CLINICAL DATA: Pt states anterior left rib area is hurting and
radiates to left posterior ribs x2 days. No known heart or lung
conditions. Smoker 1 cigarette a day.

EXAM:
CHEST - 2 VIEW

[chest pa]
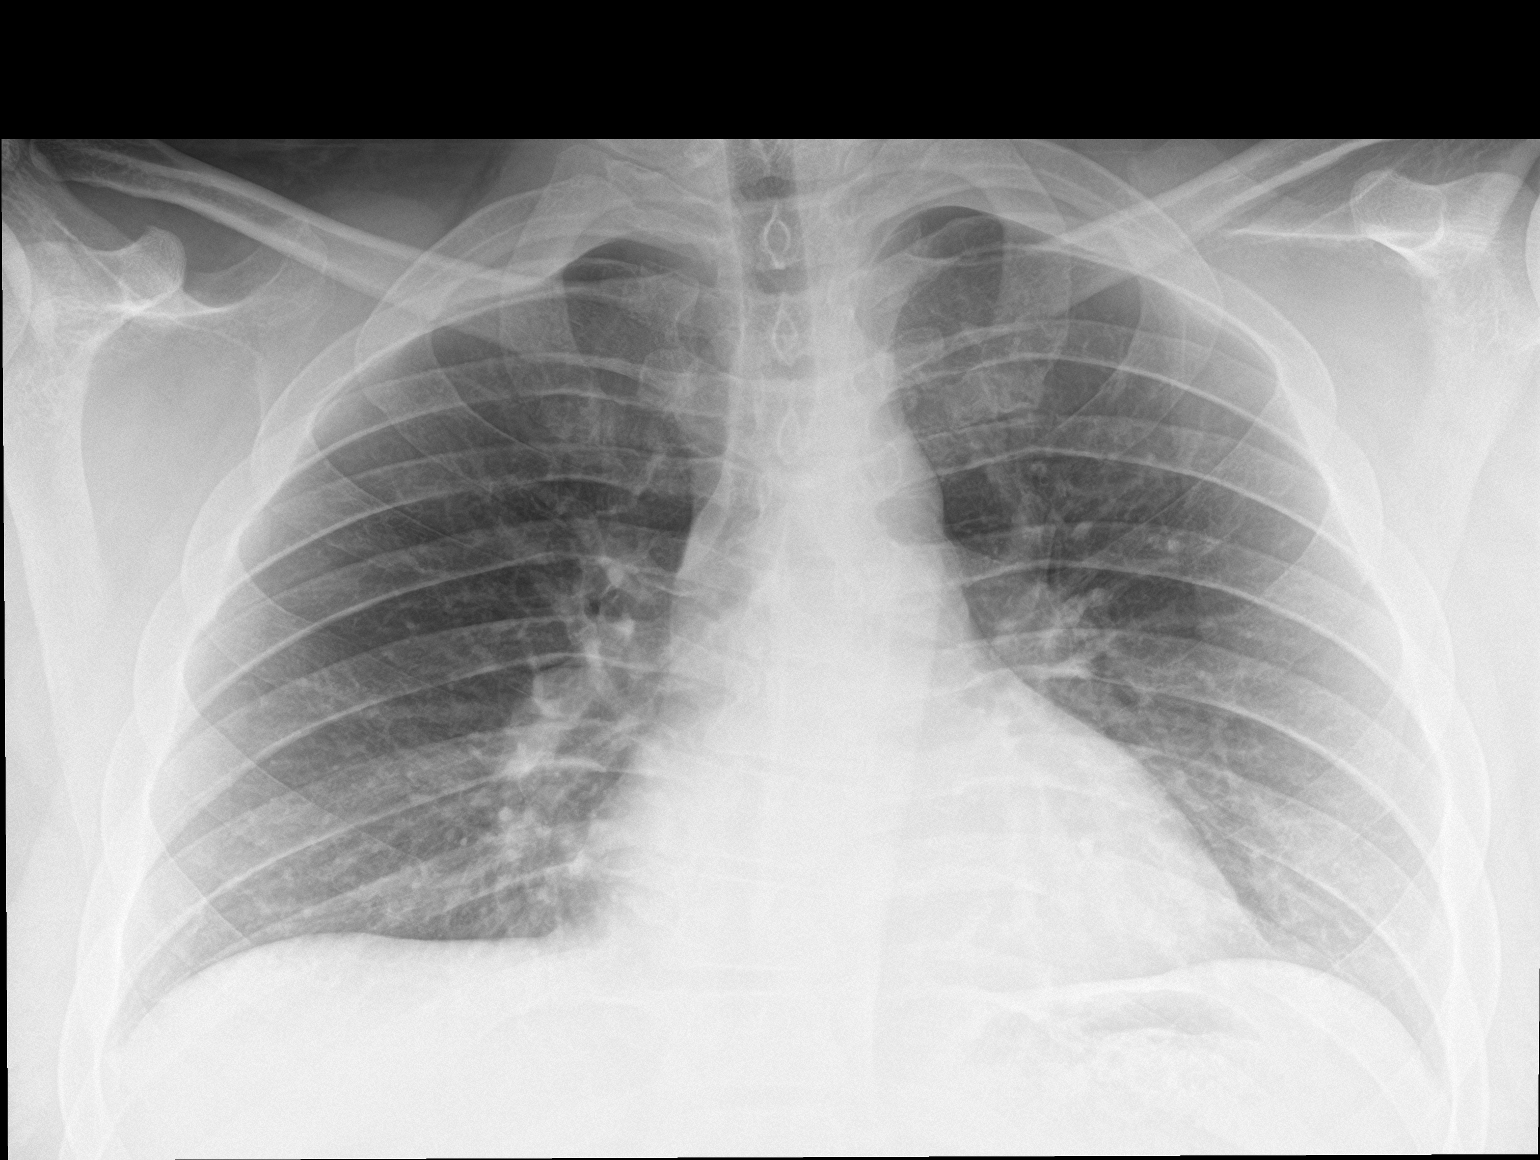

[chest lat]
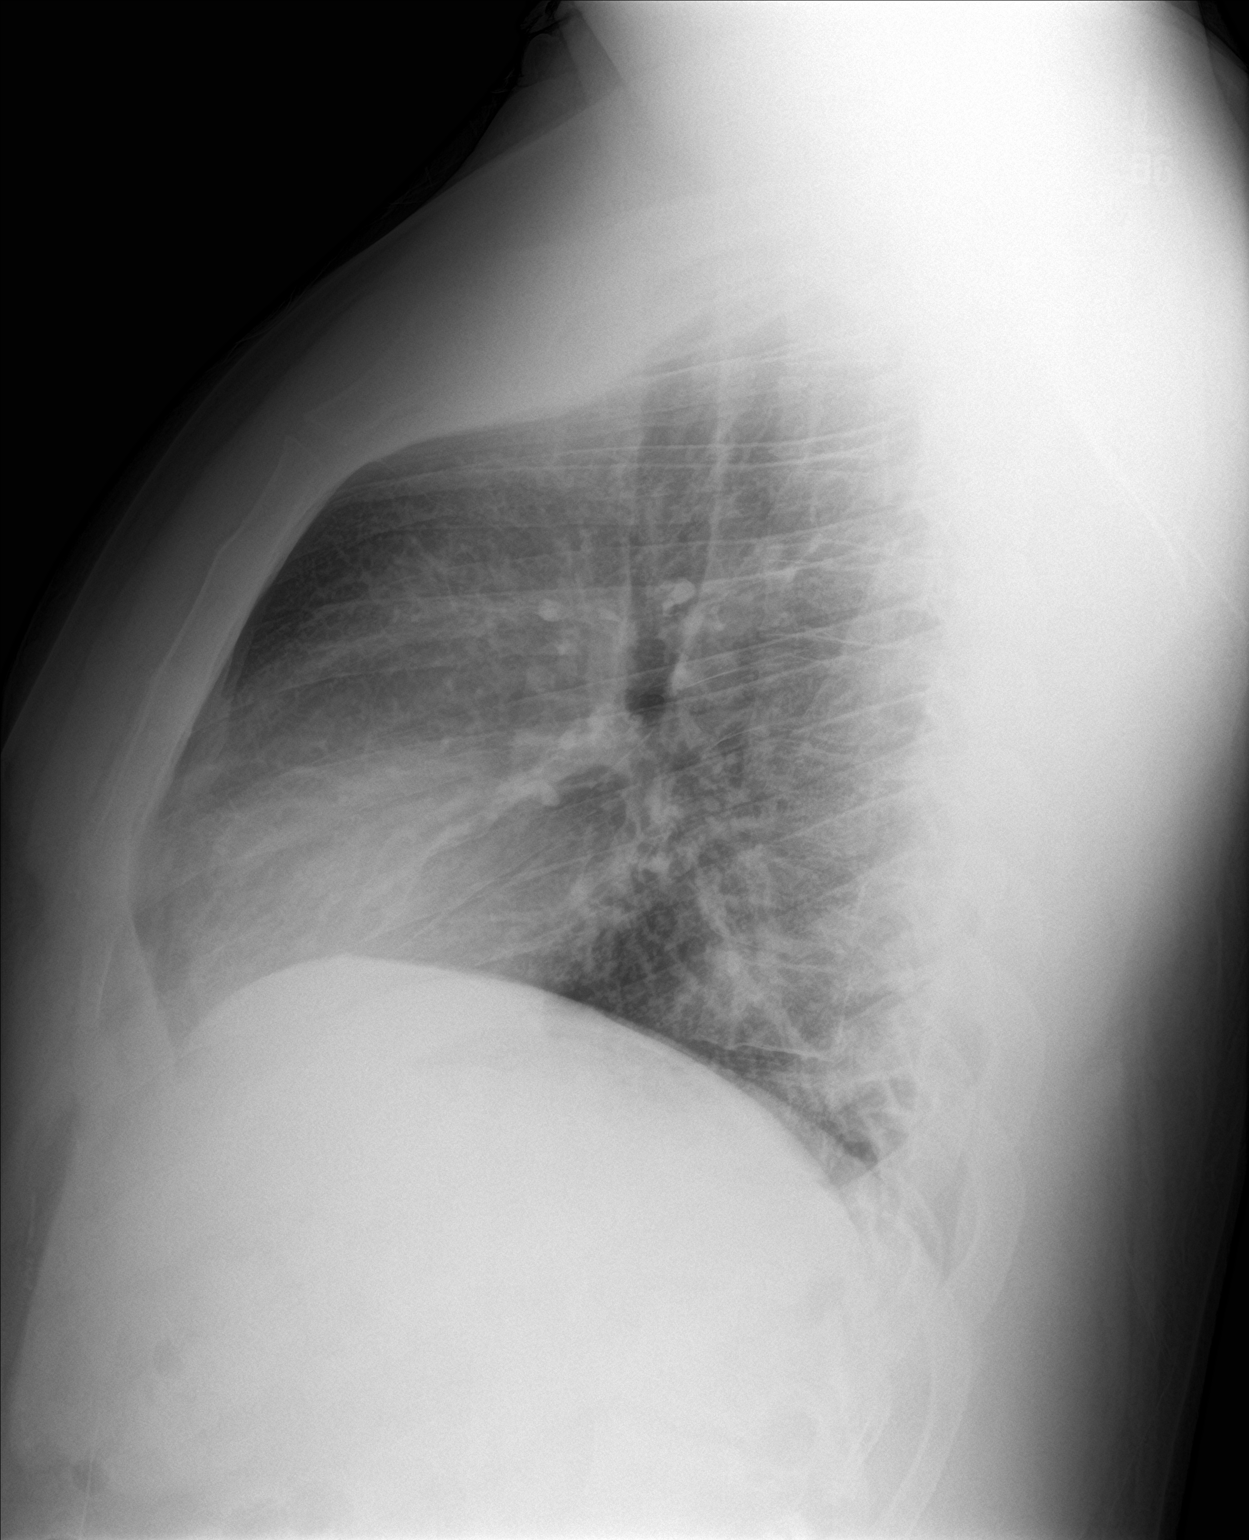

[2 of 2 positions shown; findings below may reference images not displayed]

FINDINGS: The heart size and mediastinal contours are within normal limits.
Both lungs are clear. The visualized skeletal structures are
unremarkable.
IMPRESSION: No active cardiopulmonary disease.

## 2019-03-08 ENCOUNTER — Other Ambulatory Visit: Payer: Self-pay

## 2019-03-08 ENCOUNTER — Ambulatory Visit (HOSPITAL_COMMUNITY)
Admission: EM | Admit: 2019-03-08 | Discharge: 2019-03-08 | Disposition: A | Discharge status: Home / Self Care | Payer: Self-pay | Attending: Emergency Medicine | Admitting: Emergency Medicine

## 2019-03-08 ENCOUNTER — Encounter (HOSPITAL_COMMUNITY): Payer: Self-pay

## 2019-03-08 DIAGNOSIS — B009 Herpesviral infection, unspecified: Secondary | ICD-10-CM | POA: Insufficient documentation

## 2019-03-08 DIAGNOSIS — Z202 Contact with and (suspected) exposure to infections with a predominantly sexual mode of transmission: Secondary | ICD-10-CM | POA: Insufficient documentation

## 2019-03-08 LAB — POCT URINALYSIS DIP (DEVICE)
Bilirubin Urine: NEGATIVE
Glucose, UA: NEGATIVE mg/dL
Hgb urine dipstick: NEGATIVE
Ketones, ur: NEGATIVE mg/dL
Nitrite: NEGATIVE
Protein, ur: NEGATIVE mg/dL
Specific Gravity, Urine: 1.025 (ref 1.005–1.030)
Urobilinogen, UA: 0.2 mg/dL (ref 0.0–1.0)
pH: 6 (ref 5.0–8.0)

## 2019-03-08 LAB — HIV ANTIBODY (ROUTINE TESTING W REFLEX): HIV Screen 4th Generation wRfx: NONREACTIVE

## 2019-03-08 LAB — RPR: RPR Ser Ql: NONREACTIVE

## 2019-03-08 MED ORDER — ACYCLOVIR 400 MG PO TABS: 400.0000 mg | ORAL_TABLET | Freq: Three times a day (TID) | ORAL | 0 refills | Status: AC

## 2019-03-08 NOTE — Discharge Instructions (Signed)
Advised patient to practice safe sex Take medication as prescribed Return if symptom get worse Follow up with PCP

## 2019-03-08 NOTE — ED Provider Notes (Signed)
[facility]    CSN: [id number] Arrival date & time: 03/08/19  0160      History   Chief Complaint Chief Complaint  Patient presents with  . s.74    HPI Timothy Wagner is a 28 y.o. male.   81 y old presented to the urgent care with bumps in his private area X 1 day. Patient stated he think  the body soap  he was using broke up his skin. He stated there is multiple vesicle with clear liquid when they burst. He is sexually active with 2 male partners. Denied  Burning, penile discharge, painful urination and testicular pain  The history is provided by the patient. No language interpreter was used.    History reviewed. No pertinent past medical history.  There are no active problems to display for this patient.   History reviewed. No pertinent surgical history.     Home Medications    Prior to Admission medications   Medication Sig Start Date End Date Taking? Authorizing Provider  diclofenac (VOLTAREN) 75 MG EC tablet Take 1 tablet (75 mg total) by mouth 2 (two) times daily. 09/27/17   Mardella Layman, MD  guaiFENesin-codeine 100-10 MG/5ML syrup Take 5 mLs by mouth at bedtime as needed for cough. 03/31/13   Rodolph Bong, MD  ipratropium (ATROVENT) 0.06 % nasal spray Place 2 sprays into both nostrils 4 (four) times daily. 03/31/13   Rodolph Bong, MD  predniSONE (DELTASONE) 10 MG tablet Take 3 tablets (30 mg total) by mouth daily. 03/31/13   Rodolph Bong, MD    Family History Family History  Problem Relation Age of Onset  . Hypertension Mother   . Hypertension Father     Social History Social History   Tobacco Use  . Smoking status: Never Smoker  . Smokeless tobacco: Never Used  Substance Use Topics  . Alcohol use: Yes    Comment: ocassionally  . Drug use: Yes    Types: Marijuana    Comment: daily     Allergies   Patient has no known allergies.   Review of Systems Review of Systems  Constitutional: Negative.   HENT: Negative.    Respiratory: Negative.   Cardiovascular: Negative.   Genitourinary: Positive for genital sores. Negative for discharge, dysuria, penile pain and testicular pain.     Physical Exam Triage Vital Signs ED Triage Vitals  Enc Vitals Group     BP 03/08/19 0902 128/88     Pulse Rate 03/08/19 0902 79     Resp 03/08/19 0902 18     Temp 03/08/19 0902 98.7 F (37.1 C)     Temp Source 03/08/19 0902 Oral     SpO2 03/08/19 0902 92 %     Weight 03/08/19 0901 (!) 320 lb (145.2 kg)     Height --      Head Circumference --      Peak Flow --      Pain Score 03/08/19 0900 4     Pain Loc --      Pain Edu? --      Excl. in GC? --    No data found.  Updated Vital Signs BP 128/88 (BP Location: Left Arm)   Pulse 79   Temp 98.7 F (37.1 C) (Oral)   Resp 18   Wt (!) 320 lb (145.2 kg)   SpO2 92%   Visual Acuity Right Eye Distance:   Left Eye Distance:   Bilateral Distance:  Right Eye Near:   Left Eye Near:    Bilateral Near:     Physical Exam Vitals signs and nursing note reviewed.  Constitutional:      General: He is not in acute distress.    Appearance: Normal appearance. He is normal weight. He is not ill-appearing or toxic-appearing.  HENT:     Head: Normocephalic.     Right Ear: Tympanic membrane, ear canal and external ear normal. There is no impacted cerumen.     Left Ear: Tympanic membrane, ear canal and external ear normal. There is no impacted cerumen.  Cardiovascular:     Rate and Rhythm: Normal rate and regular rhythm.     Pulses: Normal pulses.     Heart sounds: Normal heart sounds. No murmur.  Pulmonary:     Effort: Pulmonary effort is normal. No respiratory distress.     Breath sounds: Normal breath sounds.  Chest:     Chest wall: No tenderness.  Genitourinary:    Penis: Normal.      Scrotum/Testes: Normal.  Neurological:     Mental Status: He is alert.      UC Treatments / Results  Labs (all labs ordered are listed, but only abnormal results are  displayed) Labs Reviewed  POCT URINALYSIS DIP (DEVICE) - Abnormal; Notable for the following components:      Result Value   Leukocytes,Ua TRACE (*)    All other components within normal limits  RPR  HIV ANTIBODY (ROUTINE TESTING W REFLEX)  CYTOLOGY, (ORAL, ANAL, URETHRAL) ANCILLARY ONLY    EKG   Radiology No results found.  Procedures Procedures (including critical care time)  Medications Ordered in UC Medications - No data to display  Initial Impression / Assessment and Plan / UC Course  I have reviewed the triage vital signs and the nursing notes.  Pertinent labs & imaging results that were available during my care of the patient were reviewed by me and considered in my medical decision making (see chart for details).   Patient symptom and physical examination are consistent with HSV 2. He denied HSV culture to be send out, Will treat him with Acyclovir. Patient was advised to practice safe sex and to return if symptom get worse Final Clinical Impressions(s) / UC Diagnoses   Final diagnoses:  HSV-2 (herpes simplex virus 2) infection  STD exposure     Discharge Instructions     Advised patient to practice safe sex Take medication as prescribed Return if symptom get worse Follow up with PCP    ED Prescriptions    None     PDMP not reviewed this encounter.   Emerson Monte, [place] 03/08/19 [phone]

## 2019-03-08 NOTE — ED Triage Notes (Signed)
Pt. Stating he noticed some bumps in his genital area 2 days ago, denies any itching or burning.

## 2019-03-11 LAB — CYTOLOGY, (ORAL, ANAL, URETHRAL) ANCILLARY ONLY
Chlamydia: NEGATIVE
Neisseria Gonorrhea: NEGATIVE
Trichomonas: POSITIVE — AB

## 2019-03-12 ENCOUNTER — Telehealth: Payer: Self-pay | Admitting: Emergency Medicine

## 2019-03-12 MED ORDER — METRONIDAZOLE 500 MG PO TABS: 2000.0000 mg | ORAL_TABLET | Freq: Once | ORAL | 0 refills | Status: AC

## 2019-03-12 NOTE — Telephone Encounter (Signed)
Trichomonas is positive. Rx  for Flagyl 2 grams, once was sent to the pharmacy of record. Pt needs education to refrain from sexual intercourse for 7 days to give the medicine time to work. Sexual partners need to be notified and tested/treated. Condoms may reduce risk of reinfection. Recheck for further evaluation if symptoms are not improving.   Attempted to reach patient. No answer at this time. Voicemail left.     

## 2019-03-13 ENCOUNTER — Telehealth (HOSPITAL_COMMUNITY): Payer: Self-pay | Admitting: Emergency Medicine

## 2019-03-13 NOTE — Telephone Encounter (Signed)
Attempted to reach patient x2. No answer at this time. Voicemail left.    

## 2020-01-06 ENCOUNTER — Encounter (HOSPITAL_COMMUNITY): Payer: Self-pay

## 2020-01-06 ENCOUNTER — Other Ambulatory Visit: Payer: Self-pay

## 2020-01-06 ENCOUNTER — Emergency Department (HOSPITAL_COMMUNITY): Admission: EM | Admit: 2020-01-06 | Discharge: 2020-01-06 | Discharge status: Against Medical Advice | Payer: Self-pay

## 2020-01-06 ENCOUNTER — Ambulatory Visit (HOSPITAL_COMMUNITY)
Admission: EM | Admit: 2020-01-06 | Discharge: 2020-01-06 | Disposition: A | Discharge status: Home / Self Care | Payer: Self-pay | Attending: Urgent Care | Admitting: Urgent Care

## 2020-01-06 DIAGNOSIS — R350 Frequency of micturition: Secondary | ICD-10-CM | POA: Insufficient documentation

## 2020-01-06 DIAGNOSIS — Z113 Encounter for screening for infections with a predominantly sexual mode of transmission: Secondary | ICD-10-CM

## 2020-01-06 DIAGNOSIS — Z202 Contact with and (suspected) exposure to infections with a predominantly sexual mode of transmission: Secondary | ICD-10-CM | POA: Insufficient documentation

## 2020-01-06 DIAGNOSIS — R39198 Other difficulties with micturition: Secondary | ICD-10-CM | POA: Insufficient documentation

## 2020-01-06 LAB — CBG MONITORING, ED: Glucose-Capillary: 84 mg/dL (ref 70–99)

## 2020-01-06 MED ORDER — METRONIDAZOLE 500 MG PO TABS: 500.0000 mg | ORAL_TABLET | Freq: Two times a day (BID) | ORAL | 0 refills | Status: DC

## 2020-01-06 NOTE — ED Provider Notes (Signed)
[facility]   MRN: [id number] DOB: 14-Jan-1991  Subjective:   Timothy Wagner is a 29 y.o. male presenting for 3 to 4-day history of urinary frequency, tingling after urinating, mild intermittent testicular pain.  Patient states that his girlfriend tested positive for trichomoniasis.  He also reports family history of diabetes, feels thirsty.  Has not been checked for diabetes.  Denies fever, nausea, vomiting, belly pain, penile discharge, genital rash.  Does not care for HIV or syphilis testing today given that he was tested this year already.  He is not currently taking any medications and has no known food or drug allergies.  Denies past medical and surgical history.   Family History  Problem Relation Age of Onset  . Hypertension Mother   . Hypertension Father     Social History   Tobacco Use  . Smoking status: Never Smoker  . Smokeless tobacco: Never Used  Substance Use Topics  . Alcohol use: Yes    Comment: ocassionally  . Drug use: Yes    Types: Marijuana    Comment: daily    ROS   Objective:   Vitals: BP 122/73 (BP Location: Left Arm)   Pulse 68   Temp 98.3 F (36.8 C) (Oral)   Resp 18   SpO2 97%   Physical Exam Constitutional:      General: He is not in acute distress.    Appearance: Normal appearance. He is well-developed and normal weight. He is not ill-appearing, toxic-appearing or diaphoretic.  HENT:     Head: Normocephalic and atraumatic.     Right Ear: External ear normal.     Left Ear: External ear normal.     Nose: Nose normal.     Mouth/Throat:     Pharynx: Oropharynx is clear.  Eyes:     General: No scleral icterus.       Right eye: No discharge.        Left eye: No discharge.     Extraocular Movements: Extraocular movements intact.     Pupils: Pupils are equal, round, and reactive to light.  Cardiovascular:     Rate and Rhythm: Normal rate.  Pulmonary:     Effort: Pulmonary effort is normal.  Musculoskeletal:       Cervical back: Normal range of motion.  Skin:    General: Skin is warm and dry.  Neurological:     Mental Status: He is alert and oriented to person, place, and time.  Psychiatric:        Mood and Affect: Mood normal.        Behavior: Behavior normal.        Thought Content: Thought content normal.        Judgment: Judgment normal.     Results for orders placed or performed during the hospital encounter of 01/06/20 (from the past 24 hour(s))  POC CBG monitoring     Status: None   Collection Time: 01/06/20  4:21 PM  Result Value Ref Range   Glucose-Capillary 84 70 - 99 mg/dL    Assessment and Plan :   PDMP not reviewed this encounter.  1. Abnormal urination   2. Exposure to trichomonas   3. Urinary frequency     Negative for diabetes today.  Start Flagyl to cover for trichomoniasis given exposure.  STI testing pending.  Counseled on safe sex practices including abstaining for 1 week following treatment.  Counseled patient on potential for adverse effects with medications prescribed/recommended  today, ER and return-to-clinic precautions discussed, patient verbalized understanding.    Wallis Bamberg, [place] 01/06/20 1639

## 2020-01-06 NOTE — ED Notes (Signed)
Called for Pt in Waiting Room. No answer. Registration staff stated they believe Pt Left.

## 2020-01-06 NOTE — ED Notes (Signed)
NT Timothy Wagner called for Pt again. No answer. Registration saw Pt leave the ER

## 2020-01-06 NOTE — Discharge Instructions (Addendum)
Avoid all forms of sexual intercourse (oral, vaginal, anal) for the next 7 days to avoid spreading/reinfecting. Return if symptoms worsen/do not resolve, you develop fever, abdominal pain, blood in your urine, or are re-exposed to an STI.  

## 2020-01-06 NOTE — ED Triage Notes (Signed)
Pt presents for STD's testing. States having tingling sensation in the oenis for the past 3-4 days. Denies discharge, swelling, pain, dysuria.

## 2020-01-07 ENCOUNTER — Telehealth (HOSPITAL_COMMUNITY): Payer: Self-pay | Admitting: Emergency Medicine

## 2020-01-07 LAB — CYTOLOGY, (ORAL, ANAL, URETHRAL) ANCILLARY ONLY
Chlamydia: POSITIVE — AB
Comment: NEGATIVE
Comment: NEGATIVE
Comment: NORMAL
Neisseria Gonorrhea: NEGATIVE
Trichomonas: POSITIVE — AB

## 2020-01-07 MED ORDER — AZITHROMYCIN 250 MG PO TABS: 1000.0000 mg | ORAL_TABLET | Freq: Once | ORAL | 0 refills | Status: AC

## 2020-08-04 ENCOUNTER — Ambulatory Visit (HOSPITAL_COMMUNITY)
Admission: EM | Admit: 2020-08-04 | Discharge: 2020-08-04 | Disposition: A | Discharge status: Home / Self Care | Payer: Self-pay | Attending: Emergency Medicine | Admitting: Emergency Medicine

## 2020-08-04 ENCOUNTER — Encounter (HOSPITAL_COMMUNITY): Payer: Self-pay | Admitting: Emergency Medicine

## 2020-08-04 ENCOUNTER — Other Ambulatory Visit: Payer: Self-pay

## 2020-08-04 DIAGNOSIS — Z202 Contact with and (suspected) exposure to infections with a predominantly sexual mode of transmission: Secondary | ICD-10-CM | POA: Insufficient documentation

## 2020-08-04 MED ORDER — DOXYCYCLINE HYCLATE 100 MG PO CAPS: 100.0000 mg | ORAL_CAPSULE | Freq: Two times a day (BID) | ORAL | 0 refills | Status: DC

## 2020-08-04 NOTE — ED Triage Notes (Signed)
Pt presents for STD testing after known exposure to Chlamydia. Denies any symptoms at this time.

## 2020-08-04 NOTE — ED Provider Notes (Signed)
[facility]    CSN: [id number] Arrival date & time: 08/04/20  0946      History   Chief Complaint Chief Complaint  Patient presents with  . Exposure to STD    HPI Timothy Wagner. is a 30 y.o. male.   Patient presents with exposure to chlamydia. Says partner is currently being treated. Denies discharge, itching, frequency, dysuria, penile or testicle swelling, new lesions. Has 3 partners, sometimes use condoms.   History reviewed. No pertinent past medical history.  There are no problems to display for this patient.   History reviewed. No pertinent surgical history.     Home Medications    Prior to Admission medications   Medication Sig Start Date End Date Taking? Authorizing Provider  doxycycline (VIBRAMYCIN) 100 MG capsule Take 1 capsule (100 mg total) by mouth 2 (two) times daily. 08/04/20  Yes Erico Stan R, NP  metroNIDAZOLE (FLAGYL) 500 MG tablet Take 1 tablet (500 mg total) by mouth 2 (two) times daily with a meal. DO NOT CONSUME ALCOHOL WHILE TAKING THIS MEDICATION. 01/06/20   Wallis Bamberg, PA-C  ipratropium (ATROVENT) 0.06 % nasal spray Place 2 sprays into both nostrils 4 (four) times daily. 03/31/13 01/06/20  Rodolph Bong, MD    Family History Family History  Problem Relation Age of Onset  . Hypertension Mother   . Hypertension Father     Social History Social History   Tobacco Use  . Smoking status: Never Smoker  . Smokeless tobacco: Never Used  Substance Use Topics  . Alcohol use: Yes    Comment: ocassionally  . Drug use: Yes    Types: Marijuana    Comment: daily     Allergies   Patient has no known allergies.   Review of Systems Review of Systems  Defer to HPI   Physical Exam Triage Vital Signs ED Triage Vitals  Enc Vitals Group     BP 08/04/20 1133 127/69     Pulse Rate 08/04/20 1133 (!) 50     Resp 08/04/20 1133 18     Temp 08/04/20 1133 98.9 F (37.2 C)     Temp src --      SpO2 08/04/20 1133 97 %      Weight --      Height --      Head Circumference --      Peak Flow --      Pain Score 08/04/20 1131 0     Pain Loc --      Pain Edu? --      Excl. in [place]? --    No data found.  Updated Vital Signs BP 127/69 (BP Location: Right Arm)   Pulse (!) 50   Temp 98.9 F (37.2 C)   Resp 18   SpO2 97%   Visual Acuity Right Eye Distance:   Left Eye Distance:   Bilateral Distance:    Right Eye Near:   Left Eye Near:    Bilateral Near:     Physical Exam Constitutional:      Appearance: Normal appearance. He is obese.  HENT:     Head: Normocephalic.  Eyes:     Extraocular Movements: Extraocular movements intact.  Pulmonary:     Effort: Pulmonary effort is normal.  Musculoskeletal:        General: Normal range of motion.     Cervical back: Normal range of motion.  Skin:    General: Skin is warm and dry.  Neurological:  Mental Status: He is alert and oriented to person, place, and time. Mental status is at baseline.  Psychiatric:        Mood and Affect: Mood normal.        Behavior: Behavior normal.        Thought Content: Thought content normal.        Judgment: Judgment normal.      UC Treatments / Results  Labs (all labs ordered are listed, but only abnormal results are displayed) Labs Reviewed  CYTOLOGY, (ORAL, ANAL, URETHRAL) ANCILLARY ONLY    EKG   Radiology No results found.  Procedures Procedures (including critical care time)  Medications Ordered in UC Medications - No data to display  Initial Impression / Assessment and Plan / UC Course  I have reviewed the triage vital signs and the nursing notes.  Pertinent labs & imaging results that were available during my care of the patient were reviewed by me and considered in my medical decision making (see chart for details).  Exposure to STD  1. sti screening pending, will treat per protocol 2. Doxycycline 100 mg bid for 7 days 3. Advised abstinence until medication complete, strongly advised  notifing all partners for treatment   Final Clinical Impressions(s) / UC Diagnoses   Final diagnoses:  Exposure to STD     Discharge Instructions     Take antibiotic twice a day for the next 7 days  Labs pending 2-3 days, will be notified of any other infections for treatment  Do not have sex until treatment is completed, notify all partners of possible STD so that they may be tested and treated as well, if you do not and one person has an active infection, everyone will get the infection again      ED Prescriptions    Medication Sig Dispense Auth. Provider   doxycycline (VIBRAMYCIN) 100 MG capsule Take 1 capsule (100 mg total) by mouth 2 (two) times daily. 14 capsule Coburn Knaus, Elita Boone, NP     PDMP not reviewed this encounter.   Valinda Hoar, NP 08/04/20 1347

## 2020-08-04 NOTE — Discharge Instructions (Addendum)
Take antibiotic twice a day for the next 7 days  Labs pending 2-3 days, will be notified of any other infections for treatment  Do not have sex until treatment is completed, notify all partners of possible STD so that they may be tested and treated as well, if you do not and one person has an active infection, everyone will get the infection again

## 2020-08-05 ENCOUNTER — Telehealth (HOSPITAL_COMMUNITY): Payer: Self-pay | Admitting: Emergency Medicine

## 2020-08-05 LAB — CYTOLOGY, (ORAL, ANAL, URETHRAL) ANCILLARY ONLY
Chlamydia: POSITIVE — AB
Comment: NEGATIVE
Comment: NEGATIVE
Comment: NORMAL
Neisseria Gonorrhea: NEGATIVE
Trichomonas: POSITIVE — AB

## 2020-08-05 MED ORDER — METRONIDAZOLE 500 MG PO TABS: 500.0000 mg | ORAL_TABLET | Freq: Two times a day (BID) | ORAL | 0 refills | Status: DC

## 2020-08-05 NOTE — Telephone Encounter (Signed)
Resending to correct pharmacy.

## 2021-02-05 ENCOUNTER — Other Ambulatory Visit: Payer: Self-pay

## 2021-02-05 ENCOUNTER — Ambulatory Visit (HOSPITAL_COMMUNITY)
Admission: EM | Admit: 2021-02-05 | Discharge: 2021-02-05 | Disposition: A | Discharge status: Home / Self Care | Payer: Self-pay | Attending: Student | Admitting: Student

## 2021-02-05 ENCOUNTER — Encounter (HOSPITAL_COMMUNITY): Payer: Self-pay

## 2021-02-05 DIAGNOSIS — Z113 Encounter for screening for infections with a predominantly sexual mode of transmission: Secondary | ICD-10-CM

## 2021-02-05 DIAGNOSIS — R300 Dysuria: Secondary | ICD-10-CM

## 2021-02-05 MED ORDER — LIDOCAINE HCL (PF) 1 % IJ SOLN
INTRAMUSCULAR | Status: AC
  Filled 2021-02-05: qty 2

## 2021-02-05 MED ORDER — DOXYCYCLINE HYCLATE 100 MG PO CAPS: 100.0000 mg | ORAL_CAPSULE | Freq: Two times a day (BID) | ORAL | 0 refills | Status: AC

## 2021-02-05 MED ORDER — CEFTRIAXONE SODIUM 500 MG IJ SOLR
INTRAMUSCULAR | Status: AC
  Filled 2021-02-05: qty 500

## 2021-02-05 MED ORDER — CEFTRIAXONE SODIUM 500 MG IJ SOLR
500.0000 mg | Freq: Once | INTRAMUSCULAR | Status: AC
  Administered 2021-02-05: 500 mg via INTRAMUSCULAR

## 2021-02-05 NOTE — Discharge Instructions (Addendum)
-  Doxycycline twice daily for 7 days.  Make sure to wear sunscreen while spending time outside while on this medication as it can increase your chance of sunburn. You can take this medication with food if you have a sensitive stomach. -We'll call in 2-3 days with any positive test results

## 2021-02-05 NOTE — ED Provider Notes (Signed)
[facility]    CSN: [id number] Arrival date & time: 02/05/21  1056      History   Chief Complaint Chief Complaint  Patient presents with   STD Testing     HPI Timothy Wagner. is a 30 y.o. male presenting for STI screen.  Medical history chlamydia.  Describes dysuria and external penile irritation for 2 days following intercourse with male partner.  States that she is his on and off partner.  States the symptoms feel like chlamydia.  Denies penile discharge, abdominal pain, flank pain, fever/chills, penile lesions.  HPI  History reviewed. No pertinent past medical history.  There are no problems to display for this patient.   History reviewed. No pertinent surgical history.     Home Medications    Prior to Admission medications   Medication Sig Start Date End Date Taking? Authorizing Provider  doxycycline (VIBRAMYCIN) 100 MG capsule Take 1 capsule (100 mg total) by mouth 2 (two) times daily for 7 days. 02/05/21 02/12/21 Yes Hazel Sams, PA-C  ipratropium (ATROVENT) 0.06 % nasal spray Place 2 sprays into both nostrils 4 (four) times daily. 03/31/13 01/06/20  Gregor Hams, MD    Family History Family History  Problem Relation Age of Onset   Hypertension Mother    Hypertension Father     Social History Social History   Tobacco Use   Smoking status: Never   Smokeless tobacco: Never  Substance Use Topics   Alcohol use: Yes    Comment: ocassionally   Drug use: Yes    Types: Marijuana    Comment: daily     Allergies   Patient has no known allergies.   Review of Systems Review of Systems  Constitutional:  Negative for chills and fever.  HENT:  Negative for sore throat.   Eyes:  Negative for pain and redness.  Respiratory:  Negative for shortness of breath.   Cardiovascular:  Negative for chest pain.  Gastrointestinal:  Negative for abdominal pain, diarrhea, nausea and vomiting.  Genitourinary:  Positive for dysuria. Negative  for decreased urine volume, difficulty urinating, flank pain, frequency, genital sores, hematuria, penile discharge, penile pain, penile swelling, scrotal swelling, testicular pain and urgency.  Musculoskeletal:  Negative for back pain.  Skin:  Negative for rash.  All other systems reviewed and are negative.   Physical Exam Triage Vital Signs ED Triage Vitals  Enc Vitals Group     BP 02/05/21 1221 127/79     Pulse Rate 02/05/21 1221 (!) 59     Resp 02/05/21 1221 18     Temp 02/05/21 1221 98.5 F (36.9 C)     Temp Source 02/05/21 1221 Oral     SpO2 02/05/21 1221 97 %     Weight --      Height --      Head Circumference --      Peak Flow --      Pain Score 02/05/21 1219 3     Pain Loc --      Pain Edu? --      Excl. in [place]? --    No data found.  Updated Vital Signs BP 127/79 (BP Location: Left Arm)   Pulse (!) 59   Temp 98.5 F (36.9 C) (Oral)   Resp 18   SpO2 97%   Visual Acuity Right Eye Distance:   Left Eye Distance:   Bilateral Distance:    Right Eye Near:   Left Eye Near:  Bilateral Near:     Physical Exam Vitals reviewed.  Constitutional:      General: He is not in acute distress.    Appearance: Normal appearance. He is not ill-appearing.  HENT:     Head: Normocephalic and atraumatic.     Mouth/Throat:     Mouth: Mucous membranes are moist.     Comments: Moist mucous membranes Eyes:     Extraocular Movements: Extraocular movements intact.     Pupils: Pupils are equal, round, and reactive to light.  Cardiovascular:     Rate and Rhythm: Normal rate and regular rhythm.     Heart sounds: Normal heart sounds.  Pulmonary:     Effort: Pulmonary effort is normal.     Breath sounds: Normal breath sounds. No wheezing, rhonchi or rales.  Abdominal:     General: Bowel sounds are normal. There is no distension.     Palpations: Abdomen is soft. There is no mass.     Tenderness: There is no abdominal tenderness. There is no right CVA tenderness, left CVA  tenderness, guarding or rebound.  Genitourinary:    Comments: deferred Skin:    General: Skin is warm.     Capillary Refill: Capillary refill takes less than 2 seconds.     Comments: Good skin turgor  Neurological:     General: No focal deficit present.     Mental Status: He is alert and oriented to person, place, and time.  Psychiatric:        Mood and Affect: Mood normal.        Behavior: Behavior normal.     UC Treatments / Results  Labs (all labs ordered are listed, but only abnormal results are displayed) Labs Reviewed  CYTOLOGY, (ORAL, ANAL, URETHRAL) ANCILLARY ONLY    EKG   Radiology No results found.  Procedures Procedures (including critical care time)  Medications Ordered in UC Medications  cefTRIAXone (ROCEPHIN) injection 500 mg (has no administration in time range)    Initial Impression / Assessment and Plan / UC Course  I have reviewed the triage vital signs and the nursing notes.  Pertinent labs & imaging results that were available during my care of the patient were reviewed by me and considered in my medical decision making (see chart for details).     This patient is a very pleasant 30 y.o. year old male presenting with dysuria/ concern for chlamydia. Afebrile, nontachycardic, no reproducible abd pain or CVAT.  Will send self-swab for G/C, trich. Declines HIV, RPR. Safe sex precautions.   Rocephin administered. Doxycycline sent.  ED return precautions discussed. Patient verbalizes understanding and agreement.     Final Clinical Impressions(s) / UC Diagnoses   Final diagnoses:  Dysuria  Routine screening for STI (sexually transmitted infection)     Discharge Instructions      -Doxycycline twice daily for 7 days.  Make sure to wear sunscreen while spending time outside while on this medication as it can increase your chance of sunburn. You can take this medication with food if you have a sensitive stomach. -We'll call in 2-3 days with  any positive test results   ED Prescriptions     Medication Sig Dispense Auth. Provider   doxycycline (VIBRAMYCIN) 100 MG capsule Take 1 capsule (100 mg total) by mouth 2 (two) times daily for 7 days. 14 capsule Rhys Martini, PA-C      PDMP not reviewed this encounter.   Rhys Martini, PA-C 02/05/21 1319

## 2021-02-05 NOTE — ED Triage Notes (Signed)
Pt presents with penile discomfort since yesterday.

## 2021-02-08 LAB — CYTOLOGY, (ORAL, ANAL, URETHRAL) ANCILLARY ONLY
Chlamydia: NEGATIVE
Comment: NEGATIVE
Comment: NEGATIVE
Comment: NORMAL
Neisseria Gonorrhea: NEGATIVE
Trichomonas: POSITIVE — AB

## 2021-02-09 ENCOUNTER — Telehealth (HOSPITAL_COMMUNITY): Payer: Self-pay | Admitting: Emergency Medicine

## 2021-02-09 MED ORDER — METRONIDAZOLE 500 MG PO TABS: 2000.0000 mg | ORAL_TABLET | Freq: Once | ORAL | 0 refills | Status: AC

## 2021-05-25 ENCOUNTER — Encounter (HOSPITAL_COMMUNITY): Payer: Self-pay

## 2021-05-25 ENCOUNTER — Ambulatory Visit (HOSPITAL_COMMUNITY)
Admission: EM | Admit: 2021-05-25 | Discharge: 2021-05-25 | Disposition: A | Discharge status: Home / Self Care | Payer: Self-pay | Attending: Nurse Practitioner | Admitting: Nurse Practitioner

## 2021-05-25 ENCOUNTER — Other Ambulatory Visit: Payer: Self-pay

## 2021-05-25 DIAGNOSIS — L02411 Cutaneous abscess of right axilla: Secondary | ICD-10-CM

## 2021-05-25 MED ORDER — DOXYCYCLINE HYCLATE 100 MG PO CAPS: 100.0000 mg | ORAL_CAPSULE | Freq: Two times a day (BID) | ORAL | 0 refills | Status: AC

## 2021-05-25 MED ORDER — OXYCODONE-ACETAMINOPHEN 5-325 MG PO TABS: 1.0000 | ORAL_TABLET | Freq: Four times a day (QID) | ORAL | 0 refills | Status: AC | PRN

## 2021-05-25 NOTE — ED Triage Notes (Signed)
Pt c/o abscess under rt arm x3 days, no relief with hot compresses, and no drainage.

## 2021-05-25 NOTE — ED Provider Notes (Signed)
[facility]    CSN: [id number] Arrival date & time: 05/25/21  1543      History   Chief Complaint Chief Complaint  Patient presents with   Abscess    HPI Timothy Wagner. is a 31 y.o. male.   Patient presents with 4 day history of abscess to right axilla.  Has tried warm compresses, Tylenol, and ibuprofen without relief of symptoms. Says he "feels bad", denies fever, nausea, and vomiting.  He reports history of abscesses, but "never this bad."  Reports pain is a 10/10.    History reviewed. No pertinent past medical history.  There are no problems to display for this patient.   History reviewed. No pertinent surgical history.     Home Medications    Prior to Admission medications   Medication Sig Start Date End Date Taking? Authorizing Provider  doxycycline (VIBRAMYCIN) 100 MG capsule Take 1 capsule (100 mg total) by mouth 2 (two) times daily. 05/25/21  Yes Valentino Nose, NP  oxyCODONE-acetaminophen (PERCOCET/ROXICET) 5-325 MG tablet Take 1 tablet by mouth every 6 (six) hours as needed for severe pain. 05/25/21  Yes Cathlean Marseilles A, NP  ipratropium (ATROVENT) 0.06 % nasal spray Place 2 sprays into both nostrils 4 (four) times daily. 03/31/13 01/06/20  Rodolph Bong, MD    Family History Family History  Problem Relation Age of Onset   Hypertension Mother    Hypertension Father     Social History Social History   Tobacco Use   Smoking status: Never   Smokeless tobacco: Never  Substance Use Topics   Alcohol use: Yes    Comment: ocassionally   Drug use: Yes    Types: Marijuana    Comment: daily     Allergies   Patient has no known allergies.   Review of Systems Review of Systems  Constitutional: Negative.  Negative for fatigue and fever.  Gastrointestinal: Negative.  Negative for diarrhea, nausea and vomiting.  Musculoskeletal: Negative.   Skin:  Positive for wound. Negative for color change, pallor and rash.   Neurological: Negative.  Negative for dizziness, light-headedness and headaches.    Physical Exam Triage Vital Signs ED Triage Vitals  Enc Vitals Group     BP 05/25/21 1704 137/76     Pulse Rate 05/25/21 1704 60     Resp 05/25/21 1704 18     Temp 05/25/21 1704 98.8 F (37.1 C)     Temp Source 05/25/21 1704 Oral     SpO2 05/25/21 1704 99 %     Weight --      Height --      Head Circumference --      Peak Flow --      Pain Score 05/25/21 1705 10     Pain Loc --      Pain Edu? --      Excl. in [place]? --    No data found.  Updated Vital Signs BP 137/76 (BP Location: Left Arm)    Pulse 60    Temp 98.8 F (37.1 C) (Oral)    Resp 18    SpO2 99%   Visual Acuity Right Eye Distance:   Left Eye Distance:   Bilateral Distance:    Right Eye Near:   Left Eye Near:    Bilateral Near:     Physical Exam Vitals and nursing note reviewed.  Constitutional:      General: He is not in acute distress.    Appearance: Normal appearance.  He is obese. He is not toxic-appearing.  Skin:    General: Skin is warm and dry.     Capillary Refill: Capillary refill takes less than 2 seconds.     Coloration: Skin is not jaundiced.     Findings: Abscess present.     Comments: Approximately 6 cm x 2 cm abscess to right axilla pink in color with fluctuant center  Neurological:     Mental Status: He is alert and oriented to person, place, and time.     Motor: No weakness.     Gait: Gait normal.     UC Treatments / Results  Labs (all labs ordered are listed, but only abnormal results are displayed) Labs Reviewed - No data to display  EKG   Radiology No results found.  Procedures Incision and Drainage  Date/Time: 05/25/2021 6:22 PM Performed by: Valentino Nose, NP Authorized by: Valentino Nose, NP   Consent:    Consent obtained:  Verbal   Consent given by:  Patient   Risks, benefits, and alternatives were discussed: yes     Risks discussed:  Bleeding, incomplete drainage  and infection   Alternatives discussed:  Delayed treatment and alternative treatment Universal protocol:    Procedure explained and questions answered to patient or proxy's satisfaction: yes     Relevant documents present and verified: yes     Patient identity confirmed:  Verbally with patient Location:    Type:  Abscess   Size:  6 cm x 2 cm   Location:  Upper extremity   Upper extremity location:  Shoulder   Shoulder location:  R shoulder (right axilla) Pre-procedure details:    Skin preparation:  Chlorhexidine Sedation:    Sedation type:  None Anesthesia:    Anesthesia method:  Local infiltration   Local anesthetic:  Lidocaine 1% w/o epi Procedure type:    Complexity:  Simple Procedure details:    Ultrasound guidance: no     Needle aspiration: no     Incision types:  Single straight   Incision depth:  Dermal   Wound management:  Irrigated with saline   Drainage:  Bloody and purulent   Drainage amount:  Copious   Wound treatment:  Wound left open   Packing materials:  None Post-procedure details:    Procedure completion:  Tolerated with difficulty (including critical care time)  Medications Ordered in UC Medications - No data to display  Initial Impression / Assessment and Plan / UC Course  I have reviewed the triage vital signs and the nursing notes.  Pertinent labs & imaging results that were available during my care of the patient were reviewed by me and considered in my medical decision making (see chart for details).     I&D of right axilla abscess as above.  Unable to probe and deloculate secondary to patient pain and intolerance.  Start doxycycline 100 mg twice daily for 10 days to cover for MRSA.  Wound care discussed.  PDMP reviewed - no history of recent narcotic use.  Prescription given for pain medication to use sparingly for severe pain.  Return if symptoms persist without improvement.  With chills, fever, nausea/vomiting, go to Emergency Room.  Final  Clinical Impressions(s) / UC Diagnoses   Final diagnoses:  Abscess of right axilla     Discharge Instructions      Please take all of the antibiotics.  If signs of infection worsen, please come back to see [place] or go to the ER.  ED Prescriptions     Medication Sig Dispense Auth. Provider   doxycycline (VIBRAMYCIN) 100 MG capsule Take 1 capsule (100 mg total) by mouth 2 (two) times daily. 20 capsule Cathlean Marseilles A, NP   oxyCODONE-acetaminophen (PERCOCET/ROXICET) 5-325 MG tablet Take 1 tablet by mouth every 6 (six) hours as needed for severe pain. 12 tablet Valentino Nose, NP      I have reviewed the PDMP during this encounter.   Valentino Nose, NP 05/25/21 [phone]

## 2021-05-25 NOTE — Discharge Instructions (Addendum)
Please take all of the antibiotics.  If signs of infection worsen, please come back to see [place] or go to the ER.

## 2023-05-08 ENCOUNTER — Encounter (HOSPITAL_COMMUNITY): Payer: Self-pay | Admitting: *Deleted

## 2023-05-08 ENCOUNTER — Ambulatory Visit (HOSPITAL_COMMUNITY)
Admission: EM | Admit: 2023-05-08 | Discharge: 2023-05-08 | Disposition: A | Discharge status: Home / Self Care | Payer: Self-pay | Attending: Physician Assistant | Admitting: Physician Assistant

## 2023-05-08 DIAGNOSIS — Z113 Encounter for screening for infections with a predominantly sexual mode of transmission: Secondary | ICD-10-CM | POA: Insufficient documentation

## 2023-05-08 NOTE — ED Provider Notes (Signed)
[facility]    CSN: [id number] Arrival date & time: 05/08/23  1130      History   Chief Complaint Chief Complaint  Patient presents with   [facility] TRANSMITTED DISEASE    HPI Timothy Wagner. is a 33 y.o. male.   HPI   He reports a recent partner has told him they tested positive for Chlamydia He denies symptoms today  He reports partner tested positive today. He reports he and male partner were not using condoms   History reviewed. No pertinent past medical history.  There are no active problems to display for this patient.   History reviewed. No pertinent surgical history.     Home Medications    Prior to Admission medications   Medication Sig Start Date End Date Taking? Authorizing Provider  doxycycline (VIBRAMYCIN) 100 MG capsule Take 1 capsule (100 mg total) by mouth 2 (two) times daily. 05/25/21   Valentino Nose, NP  oxyCODONE-acetaminophen (PERCOCET/ROXICET) 5-325 MG tablet Take 1 tablet by mouth every 6 (six) hours as needed for severe pain. 05/25/21   Valentino Nose, NP  ipratropium (ATROVENT) 0.06 % nasal spray Place 2 sprays into both nostrils 4 (four) times daily. 03/31/13 01/06/20  Rodolph Bong, MD    Family History Family History  Problem Relation Age of Onset   Hypertension Mother    Hypertension Father     Social History Social History   Tobacco Use   Smoking status: Never   Smokeless tobacco: Never  Vaping Use   Vaping status: Never Used  Substance Use Topics   Alcohol use: Yes    Comment: ocassionally   Drug use: Yes    Types: Marijuana    Comment: daily     Allergies   Patient has no known allergies.   Review of Systems Review of Systems  Constitutional:  Negative for chills and fever.  Genitourinary:  Negative for dysuria, genital sores, hematuria, penile discharge, penile pain, penile swelling, scrotal swelling and testicular pain.     Physical Exam Triage Vital Signs ED Triage Vitals  [05/08/23 1348]  Encounter Vitals Group     BP 108/69     Systolic BP Percentile      Diastolic BP Percentile      Pulse Rate (!) 57     Resp 16     Temp 98.6 F (37 C)     Temp Source Oral     SpO2 95 %     Weight      Height      Head Circumference      Peak Flow      Pain Score 0     Pain Loc      Pain Education      Exclude from Growth Chart    No data found.  Updated Vital Signs BP 108/69 (BP Location: Left Arm)   Pulse (!) 57   Temp 98.6 F (37 C) (Oral)   Resp 16   SpO2 95%   Visual Acuity Right Eye Distance:   Left Eye Distance:   Bilateral Distance:    Right Eye Near:   Left Eye Near:    Bilateral Near:     Physical Exam Vitals reviewed.  Constitutional:      General: He is awake.     Appearance: Normal appearance. He is well-developed and well-groomed.  HENT:     Head: Normocephalic and atraumatic.  Eyes:     Extraocular Movements: Extraocular movements intact.  Conjunctiva/sclera: Conjunctivae normal.  Pulmonary:     Effort: Pulmonary effort is normal.  Musculoskeletal:     Cervical back: Normal range of motion.  Neurological:     General: No focal deficit present.     Mental Status: He is alert and oriented to person, place, and time.     GCS: GCS eye subscore is 4. GCS verbal subscore is 5. GCS motor subscore is 6.  Psychiatric:        Attention and Perception: Attention normal.        Mood and Affect: Mood normal.        Speech: Speech normal.        Behavior: Behavior normal. Behavior is cooperative.      UC Treatments / Results  Labs (all labs ordered are listed, but only abnormal results are displayed) Labs Reviewed  CYTOLOGY, (ORAL, ANAL, URETHRAL) ANCILLARY ONLY    EKG   Radiology No results found.  Procedures Procedures (including critical care time)  Medications Ordered in UC Medications - No data to display  Initial Impression / Assessment and Plan / UC Course  I have reviewed the triage vital signs and the  nursing notes.  Pertinent labs & imaging results that were available during my care of the patient were reviewed by me and considered in my medical decision making (see chart for details).      Final Clinical Impressions(s) / UC Diagnoses   Final diagnoses:  Screen for STD (sexually transmitted disease)   Patient reports that a partner has tested positive for chlamydia.  He reports that they have engaged in unprotected sex and he would like to have testing for rule out. He denies current symptoms at this time. Will get cytology and test for gonorrhea, chlamydia, trichomonas.  Results to dictate further management  Follow-up as needed    Discharge Instructions      We will keep you updated on the results of your testing once it is available.  If medications are indicated those will be sent in to the pharmacy on file.  Please make sure that you are having protected sex and using condoms.  If your partner is being treated for an STD please make sure that you are not sexually active with that partner until you have completely finished their antibiotic.     ED Prescriptions   None    PDMP not reviewed this encounter.   Maury Groninger, Oswaldo Conroy, PA-C 05/08/23 1420

## 2023-05-08 NOTE — ED Triage Notes (Signed)
Pt states he would like STI cyto only. He states his partner tested positive for chlamydia. He denies any sx

## 2023-05-08 NOTE — Discharge Instructions (Signed)
We will keep you updated on the results of your testing once it is available.  If medications are indicated those will be sent in to the pharmacy on file.  Please make sure that you are having protected sex and using condoms.  If your partner is being treated for an STD please make sure that you are not sexually active with that partner until you have completely finished their antibiotic.

## 2023-05-09 LAB — CYTOLOGY, (ORAL, ANAL, URETHRAL) ANCILLARY ONLY
Chlamydia: NEGATIVE
Comment: NEGATIVE
Comment: NEGATIVE
Comment: NORMAL
Neisseria Gonorrhea: NEGATIVE
Trichomonas: NEGATIVE
